# Patient Record
Sex: Male | Born: 1985 | Race: Black or African American | Hispanic: No | Marital: Single | Smoking: Current every day smoker
Health system: Southern US, Community
[De-identification: ages and names within clinical notes are randomized; demographics above are authoritative.]

## PROBLEM LIST (undated history)

## (undated) DIAGNOSIS — L732 Hidradenitis suppurativa: Secondary | ICD-10-CM

## (undated) DIAGNOSIS — L0591 Pilonidal cyst without abscess: Secondary | ICD-10-CM

## (undated) DIAGNOSIS — K611 Rectal abscess: Secondary | ICD-10-CM

---

## 2011-01-03 ENCOUNTER — Inpatient Hospital Stay (HOSPITAL_COMMUNITY)
Admission: AD | Admit: 2011-01-03 | Discharge: 2011-01-09 | DRG: 348 | Disposition: A | Discharge status: Home / Self Care | Payer: Self-pay | Source: Ambulatory Visit | Attending: Surgery | Admitting: Surgery

## 2011-01-03 DIAGNOSIS — L02219 Cutaneous abscess of trunk, unspecified: Secondary | ICD-10-CM | POA: Diagnosis present

## 2011-01-03 DIAGNOSIS — K5909 Other constipation: Secondary | ICD-10-CM | POA: Diagnosis present

## 2011-01-03 DIAGNOSIS — B9689 Other specified bacterial agents as the cause of diseases classified elsewhere: Secondary | ICD-10-CM | POA: Diagnosis present

## 2011-01-03 DIAGNOSIS — L03319 Cellulitis of trunk, unspecified: Secondary | ICD-10-CM | POA: Diagnosis present

## 2011-01-03 DIAGNOSIS — T50995A Adverse effect of other drugs, medicaments and biological substances, initial encounter: Secondary | ICD-10-CM | POA: Diagnosis present

## 2011-01-03 DIAGNOSIS — K612 Anorectal abscess: Principal | ICD-10-CM | POA: Diagnosis present

## 2011-01-03 HISTORY — PX: OTHER SURGICAL HISTORY: SHX169

## 2011-01-03 LAB — BASIC METABOLIC PANEL
BUN: 15 mg/dL (ref 6–23)
Chloride: 100 mEq/L (ref 96–112)
Glucose, Bld: 90 mg/dL (ref 70–99)
Potassium: 4.2 mEq/L (ref 3.5–5.1)

## 2011-01-03 LAB — URINALYSIS, ROUTINE W REFLEX MICROSCOPIC
Nitrite: NEGATIVE
Protein, ur: 30 mg/dL — AB
pH: 5.5 (ref 5.0–8.0)

## 2011-01-03 LAB — CBC
MCH: 23.2 pg — ABNORMAL LOW (ref 26.0–34.0)
MCHC: 32.3 g/dL (ref 30.0–36.0)
Platelets: 281 10*3/uL (ref 150–400)
RBC: 5.6 MIL/uL (ref 4.22–5.81)

## 2011-01-04 LAB — CBC
HCT: 37.5 % — ABNORMAL LOW (ref 39.0–52.0)
MCHC: 32.5 g/dL (ref 30.0–36.0)
MCV: 72 fL — ABNORMAL LOW (ref 78.0–100.0)
RDW: 12.5 % (ref 11.5–15.5)

## 2011-01-05 ENCOUNTER — Other Ambulatory Visit: Payer: Self-pay | Admitting: General Surgery

## 2011-01-05 LAB — CBC
HCT: 36.7 % — ABNORMAL LOW (ref 39.0–52.0)
MCHC: 31.9 g/dL (ref 30.0–36.0)
RDW: 12.6 % (ref 11.5–15.5)

## 2011-01-06 LAB — CBC
MCH: 23.2 pg — ABNORMAL LOW (ref 26.0–34.0)
Platelets: 339 10*3/uL (ref 150–400)
RBC: 4.88 MIL/uL (ref 4.22–5.81)
RDW: 12.8 % (ref 11.5–15.5)

## 2011-01-07 LAB — DIFFERENTIAL
Blasts: 0 %
Eosinophils Absolute: 0.4 10*3/uL (ref 0.0–0.7)
Eosinophils Relative: 4 % (ref 0–5)
Myelocytes: 0 %
Neutro Abs: 3.3 10*3/uL (ref 1.7–7.7)
Neutrophils Relative %: 33 % — ABNORMAL LOW (ref 43–77)
Promyelocytes Absolute: 0 %
nRBC: 0 /100 WBC

## 2011-01-07 LAB — CBC
HCT: 36.3 % — ABNORMAL LOW (ref 39.0–52.0)
Hemoglobin: 11.9 g/dL — ABNORMAL LOW (ref 13.0–17.0)
MCV: 71.3 fL — ABNORMAL LOW (ref 78.0–100.0)
RBC: 5.09 MIL/uL (ref 4.22–5.81)
WBC: 10.1 10*3/uL (ref 4.0–10.5)

## 2011-01-08 LAB — CBC
HCT: 36.5 % — ABNORMAL LOW (ref 39.0–52.0)
MCH: 23.3 pg — ABNORMAL LOW (ref 26.0–34.0)
MCHC: 32.6 g/dL (ref 30.0–36.0)
RDW: 12.6 % (ref 11.5–15.5)

## 2011-01-08 LAB — DIFFERENTIAL
Band Neutrophils: 0 % (ref 0–10)
Basophils Relative: 0 % (ref 0–1)
Eosinophils Absolute: 0.7 10*3/uL (ref 0.0–0.7)
Eosinophils Relative: 9 % — ABNORMAL HIGH (ref 0–5)
Lymphocytes Relative: 27 % (ref 12–46)
Lymphs Abs: 2.2 10*3/uL (ref 0.7–4.0)
Metamyelocytes Relative: 0 %
Monocytes Absolute: 1.1 10*3/uL — ABNORMAL HIGH (ref 0.1–1.0)
Monocytes Relative: 14 % — ABNORMAL HIGH (ref 3–12)

## 2011-01-08 NOTE — H&P (Signed)
  NAMETRELL, Huber          ACCOUNT NO.:  [id number]  MEDICAL RECORD NO.:  [id number]  LOCATION:                                 FACILITY:  PHYSICIAN:  Lorne Skeens. Elis Sauber, M.D.DATE OF BIRTH:  10/23/1985  DATE OF ADMISSION:  01/03/2011 DATE OF DISCHARGE:                             HISTORY & PHYSICAL   Note, he does not yet have a medical record number assigned.  CHIEF COMPLAINT:  Increasing pain and swelling, buttock and perineum.  HISTORY:  Richard Huber is a 25 year old male referred from [facility] today by Dr. Faustino Congress.  He has an approximately 5- day history of progressive increasing pain and swelling along his right buttock extending up towards his scrotum.  Today it is exquisitely painful.  He says he felt although has had no documented fever.  His mother states he has history of a pilonidal abscesses and some tooth abscesses but no infection of these exact area.  PAST MEDICAL HISTORY:  Other than above, unremarkable with no previous surgery, illness or hospitalizations.  MEDICATIONS:  None.  ALLERGIES:  None.  SOCIAL HISTORY:  Smokes an occasional cigarette.  Drinks alcohol rarely. He is employed.  Single.  REVIEW OF SYSTEMS:  GI:  He has some difficulty in pain with bowel movements with this illness.  Review of systems generally unremarkable.  PHYSICAL EXAMINATION:  VITAL SIGNS:  He is 6 feet 4 inches, weight 250 pounds, blood pressure 176/80, pulse is 112, temperature 99.2. GENERAL:  He is a muscular black male, who is in obvious pain. HEENT:  No masses.  Sclerae nonicteric.  Oropharynx clear. LUNGS:  Clear without wheezing or increased work of breathing. CARDIAC:  Regular, tachycardia.  No murmurs. ABDOMEN:  Generally soft and nontender. RECTAL:  There is a fairly large area of induration and tenderness along the right buttock perianal area extending up towards the base of the scrotum.  This is exquisitely tender to touch and he  really will not let me examine him to any degree particularly around the anus.  ASSESSMENT/PLAN:  Perineal or perirectal induration, cellulitis, probable perirectal or perineal abscess.  Certainly more than can be handled in the office setting.  We will plan to admit the patient to [facility].  I have discussed with Dr. Daphine Deutscher.  He is on call tonight and will proceed with exam under anesthesia and probable I and D.  He is receiving broad-spectrum IV antibiotics.     Lorne Skeens. Kandee Escalante, M.D.     Tory Emerald  D:  01/03/2011  T:  01/04/2011  Job:  [id number]  Electronically Signed by Glenna Fellows M.D. on 01/08/2011 01:41:22 PM

## 2011-01-10 LAB — ANAEROBIC CULTURE

## 2011-01-10 NOTE — Op Note (Signed)
  NAMEISAM, UNREIN          ACCOUNT NO.:  [id number]  MEDICAL RECORD NO.:  [id number]  LOCATION:  1533                         FACILITY:  [facility]  PHYSICIAN:  Thornton Park. Daphine Deutscher, MD  DATE OF BIRTH:  [birth year]  DATE OF PROCEDURE: DATE OF DISCHARGE:                              OPERATIVE REPORT   PROCEDURE:  Exam under anesthesia with an incision and drainage of complex perirectal abscess.  Cultures obtained.  SURGEON:  Thornton Park. Daphine Deutscher, MD  ANESTHESIA:  General endotracheal.  DESCRIPTION OF PROCEDURE:  This 25 year old African-American man was seen in the office earlier by Dr. Glenna Fellows and sent to [facility] from the urgent office for drainage of a complex perineal abscess. The area of induration was really down on the right side and then extended up at the area between his anus and his scrotum where there was induration.  Initially after prepping and draping him, I went into the area on the right side with an 18-gauge needle and got no frank pus but I went ahead because that was where it felt more flexion and opened that up and was able to insert my finger.  Soon thereafter, entered the abscess cavity which I found extended up toward the scrotum that kind of went over slightly to the left.  With my finger inside, I cut down just to the skin into the abscess cavity where it was kind of coming down on the left and brought that out.  I cut a 1/4-inch Penrose drain and actually fenestrated on the inside and then placed it in this cavity, so that it could be kept open to drain up both sides.  I irrigated with hydrogen peroxide.  As I mentioned, cultures were obtained.  A pad was placed and a sterile mesh panties were used.  The patient would be kept on Unasyn and will be admitted to the [facility].     Thornton Park Daphine Deutscher, MD     MBM/MEDQ  D:  01/03/2011  T:  01/04/2011  Job:  [id number]  Electronically Signed by Luretha Murphy MD on 01/10/2011 04:33:38 PM

## 2011-01-29 LAB — CULTURE, ROUTINE-ABSCESS

## 2011-01-30 NOTE — Discharge Summary (Signed)
Richard Huber          ACCOUNT NO.:  [id number]  MEDICAL RECORD NO.:  [id number]  LOCATION:  1533                         FACILITY:  [facility]  PHYSICIAN:  Thornton Park. Daphine Deutscher, MD  DATE OF BIRTH:  Feb 06, 1986  DATE OF ADMISSION:  01/03/2011 DATE OF DISCHARGE:  01/09/2011                              DISCHARGE SUMMARY   HISTORY OF PRESENT ILLNESS:  Richard Huber is a 25 year old gentleman who presented to the office with complaint of increasing pain and swelling along the right buttock extending towards the scrotum.  He had associated fever.  The patient was seen in the office by Dr. Johna Sheriff who felt that this patient needed operative incision and drainage and admission for inpatient antibiotics.  Therefore, the patient agreed to admission on January 03, 2011.  SUMMARY OF HOSPITAL COURSE:  The patient was admitted on January 03, 2011, was taken to the operating room by Dr. Wenda Low and underwent incision and drainage of perirectal/perineal abscess.  His admission white blood cell count was noted to be approximately 20,000.  The patient had a Penrose drain placed at that time.  Postoperatively the patient felt better and was not having any significant pain.  However, his white blood cell count really never budged.  There was minimal drainage coming from the Penrose drain after the first 36 hours or so. His white blood cell count remained at 20,000.  Therefore, Dr. Zachery Dakins felt it was appropriate to perform a repeat incision and drainage to possibly drain a deeper area.  Therefore he was taken to the operating room on January 05, 2011, and underwent exam under anesthesia and ultrasound and opening of previously drained abscess site with debridement of some necrotic tissue.  The patient tolerated this procedure well, had some packing placed in addition to his Penrose drain.  His secondary drainage was more successful in alleviating the abscess cavity.  His temperature curve  immediately went to normal.  His white blood cell count trended down to 12 by the next day and then eventually to a normal level.  His Penrose has been removed.  He has been doing sitz baths and soaks in the bathroom and has done well.  The patient has developed some constipation secondary to pain medication use but is otherwise using some MiraLax and stool softeners.  He has not been having any abdominal distention or pain.  The patient's cultures are still pending but have preliminarily grown gram-negative rods and gram- positive cocci.  We suspect mixed flora.  I feel the patient is appropriate for discharge home.  At present time he has been afebrile and has had normal white count for the past 48 hours and his pain is well controlled with p.o. pain medication.  DISCHARGE DIAGNOSES: 1. Perirectal/perineal abscess status post incision and drainage x2. 2. Constipation secondary to narcotic use.  DISCHARGE MEDICATIONS:  The patient's discharge medications will be: 1. Augmentin 875 mg 1 tablet twice a day. 2. Colace 100 mg twice daily. 3. Percocet 5/325 one to two tablets q.4 h p.r.n. pain. 4. MiraLax 17 g dose twice daily. 5. Ibuprofen 200 mg tablets 2 to 4 tablets q.8 h p.r.n. pain.  PLAN:  He is asked to continue warm soaks  in the tub or use of a shower sprayer keeping this area.  He can keep it dry, absorbent pad back in that area to absorb any drainage.  Advised against using anything like peroxide, Betadine or powders.  He is to come back and see Dr. Daphine Deutscher or Dr. Zachery Dakins in the office in approximately 7 to 10 days for recheck.     Brayton El, PA-C   ______________________________ Thornton Park Daphine Deutscher, MD    KB/MEDQ  D:  01/09/2011  T:  01/09/2011  Job:  [id number]  Electronically Signed by Brayton El  on 01/22/2011 02:36:13 PM Electronically Signed by Luretha Murphy MD on 01/30/2011 10:16:16 AM

## 2011-02-06 NOTE — Op Note (Signed)
[name], [name]          ACCOUNT NO.:  [id number]  MEDICAL RECORD NO.:  [id number]  LOCATION:  1533                         FACILITY:  [facility]  PHYSICIAN:  Anselm Pancoast. Raylen Tangonan, M.D.DATE OF BIRTH:  July 14, 1986  DATE OF PROCEDURE:  01/05/2011 DATE OF DISCHARGE:                              OPERATIVE REPORT   PREOPERATIVE DIAGNOSIS:  Persistent perianal infection, status post drainage of a kind of superficial large perianal abscess anteriorly by Dr. Daphine Deutscher 48 hours earlier.  OPERATION:  Examination under anesthesia in lithotomy position, anal ultrasound, and opening of the previously drained site and debridement of superficial chronic abscess, general anesthesia.  HISTORY:  Richard Huber is a 25 year old quite large black male who was admitted after the urgent office on Wednesday.  The patient was seen by Dr. Johna Sheriff and it was impossible to actually examine him in the office because of the pain.  He was sent over and taken to the operating room.  Dr. Daphine Deutscher was on-call and he examined him in the lithotomy position, put a needle on the left side where he had a large area of induration, but no pus was encountered, and then made a little incision to the left of anterior midline where there was a small amount of infection and then bluntly with his finger connected the 2 areas, kind of washed out, did a culture, and placed a 1/4 inch Penrose drain.  The patient, the following morning, was feeling better.  He had a white count on admission that was about 21,000.  The following morning, the patient's pain was definitely less.  He was having some purulent drainage through the little Penrose drain and his white count was down to 19,300 and the patient's tachycardia had decreased, but he was still in pretty significant pain.  The cultures so far are showing kind of a mixed bacteria, no anaerobes.  We do not actually have a final culture yet.  Today, the patient was still  having pain and his white count is back to 20,500 again.  Attempts to examine the patient in the bed basically were unsuccessful.  We tried to do a rectal exam and he just fought [place].  The little drainage from the little Penrose drain area was kind of soupy area and I thought that he probably had an undrained area on my examination and with a white count.  I got Dr. Ezzard Standing who worked last night to examine the patient and he was in agreement that the incisions were small.  The patient was certainly tender and whether a repeat exploration or might be a CT or what, but clinically he was acting as if there was still an area that was not adequately drained. The patient said we could drain him or reexamine him and drain him in the operating room as long as he was asleep, he just did not want [place] to do anything, trying to do it under local anesthesia at bedside or examination.  I recommended that we take him back to the OR, do an anal ultrasound the first thing to make sure I could not see a perirectal abscess, examine him very thoroughly, and then open the incision somewhat more.  DESCRIPTION OF PROCEDURE:  The  patient was taken to the operative suite. He was given a dose of Zosyn, which he was on, and induction of general anesthesia,  endotracheal tube, and then put up in the [place] stirrups.  First, on examining, there was this kind of drainage from the little Penrose drain sites and first I did a careful rectal exam, I did not feel any fluctuance or anything on the rectal exam.  Next, I used an anal ultrasound, not the rectal but the anal, and you could see the sphincter, you could go see the various areas, you could see a little air up in the subcutaneous tissue where the little Penrose drain was, but nothing that looked like a deep perirectal abscess that I could appreciate.  I then put a little peroxide in the little drain site and re-anoscoped him and you could see the little air  better, but no additional inflammation was encountered.  Next, I went ahead and prepped him with Betadine scrub and solution, draped him in a sterile manner. Time-out was then completed a second time and we then used an anoscope to look into the anus.  I used a little Shepherd hook, I could not find any evidence of the internal fistula either internally or externally. With him in this position and the anoscope being unsuccessful, I then opened up the area where he had got more induration, which was on the right side, not really going down to the thigh and the skin itself looked good, and just opened the excision kind of closer to groin towards the midline, not really going to the anus, and then used the __________ and, etc., and just debrided this chronic infection and removed the chronically infected tissue that I could visualize.  With this and then with finger in his anus, palpated, etc., I could not see any pockets of pus growing up, etc.  I then opened the little incision that was anteriorly in a similar manner and from Dr. Ermalene Searing description it appears that the area of the smaller incision anteriorly to the left was where he actually encountered the actual pus, but I could find anything that looked like a fistulous tract going to the anterior anal area.  I debrided out the infected soft tissue, no additional cultures were performed, and then replaced the little 1/4 inch Penrose with a 1/2 inch Penrose drain and then sutured the 2 ends together so that we got a much larger area for drainage.  I then packed the cavity with three open 4 x 4's soaked in Betadine and then sutured the ends all together so that the packing can be removed tomorrow.  If he is still having a persistent leukocytosis and has much pain, he may need a CT.  I think I will add Septra or vancomycin to his wound cultures and his mother, who I talked to, says that he has had an infected tooth in the last year or two  and his face swells up __________ to that and then about 6 months ago he had a pilonidal area, but that healed up fairly quickly and I am not finding any evidence of any infection posteriorly.  Hopefully, I have not missed a perirectal or ischiorectal abscess and we will continue to treat him with the antibiotics IV.     Anselm Pancoast. Zachery Dakins, M.D.     WJW/MEDQ  D:  01/05/2011  T:  01/06/2011  Job:  [id number]  Electronically Signed by Consuello Bossier M.D. on 02/06/2011 03:48:46 PM

## 2011-02-09 ENCOUNTER — Encounter (INDEPENDENT_AMBULATORY_CARE_PROVIDER_SITE_OTHER): Payer: Self-pay | Admitting: General Surgery

## 2013-02-24 ENCOUNTER — Emergency Department (HOSPITAL_COMMUNITY)
Admission: EM | Admit: 2013-02-24 | Discharge: 2013-02-24 | Disposition: A | Discharge status: Home / Self Care | Payer: Self-pay | Attending: Emergency Medicine | Admitting: Emergency Medicine

## 2013-02-24 ENCOUNTER — Encounter (HOSPITAL_COMMUNITY): Payer: Self-pay

## 2013-02-24 DIAGNOSIS — Z872 Personal history of diseases of the skin and subcutaneous tissue: Secondary | ICD-10-CM | POA: Insufficient documentation

## 2013-02-24 DIAGNOSIS — F17200 Nicotine dependence, unspecified, uncomplicated: Secondary | ICD-10-CM | POA: Insufficient documentation

## 2013-02-24 DIAGNOSIS — L0501 Pilonidal cyst with abscess: Secondary | ICD-10-CM | POA: Insufficient documentation

## 2013-02-24 DIAGNOSIS — IMO0001 Reserved for inherently not codable concepts without codable children: Secondary | ICD-10-CM | POA: Insufficient documentation

## 2013-02-24 DIAGNOSIS — R509 Fever, unspecified: Secondary | ICD-10-CM | POA: Insufficient documentation

## 2013-02-24 MED ORDER — SODIUM CHLORIDE 0.9 % IV BOLUS (SEPSIS): 1000.0000 mL | Freq: Once | INTRAVENOUS | Status: DC

## 2013-02-24 MED ORDER — DOXYCYCLINE HYCLATE 100 MG PO CAPS: 100.0000 mg | ORAL_CAPSULE | Freq: Two times a day (BID) | ORAL | Status: DC

## 2013-02-24 MED ORDER — OXYCODONE-ACETAMINOPHEN 5-325 MG PO TABS: 2.0000 | ORAL_TABLET | ORAL | Status: DC | PRN

## 2013-02-24 MED ORDER — CLINDAMYCIN PHOSPHATE 600 MG/50ML IV SOLN
600.0000 mg | Freq: Once | INTRAVENOUS | Status: DC
  Filled 2013-02-24: qty 50

## 2013-02-24 NOTE — ED Notes (Signed)
Boil present at lower back. No drainage. Swelling present very tender. Denies N/V/D and fever

## 2013-02-24 NOTE — ED Provider Notes (Signed)
CSN: [id number]     Arrival date & time 02/24/13  1740 History     First MD Initiated Contact with Patient 02/24/13 1811     Chief Complaint  Patient presents with  . Recurrent Skin Infections   (Consider location/radiation/quality/duration/timing/severity/associated sxs/prior Treatment) HPI  Richard Huber is a(n) 27 y.o. male who presents with chief complaint of abscess.  He history of recurrent abscess of the gluteal cleft.  Patient states it began Thursday with some mild pain at the superior aspect of the gluteal cleft on the left side.  He's had increasing tenderness and pain in that area.  He states he's had this several times before and generally uses warm compresses until it opens and drains.  He has had to have several I&D for this area and has had previous admission for cellulitis.  He states that over the past 2 days he's had subjective fever, chills and myalgia.. Denies DOE, SOB, chest tightness or pressure, radiation to left arm, jaw or back, or diaphoresis. Denies dysuria, flank pain, suprapubic pain, frequency, urgency, or hematuria. Denies headaches, light headedness, weakness, visual disturbances. Denies abdominal pain, nausea, vomiting, diarrhea or constipation.     Past Medical History  Diagnosis Date  . Boil    History reviewed. No pertinent past surgical history. No family history on file. History  Substance Use Topics  . Smoking status: Current Every Day Smoker  . Smokeless tobacco: Not on file  . Alcohol Use: Yes    Review of Systems Ten systems reviewed and are negative for acute change, except as noted in the HPI.   Allergies  Review of patient's allergies indicates no known allergies.  Home Medications   Current Outpatient Rx  Name  Route  Sig  Dispense  Refill  . ibuprofen (ADVIL,MOTRIN) 200 MG tablet   Oral   Take 200 mg by mouth every 6 (six) hours as needed for pain.          BP 145/90  Pulse 116  Temp(Src) 99.9 F (37.7 C) (Oral)   Resp 18  Ht 6\' 4"  (1.93 m)  Wt 300 lb (136.079 kg)  BMI 36.53 kg/m2  SpO2 99% Physical Exam  Nursing note and vitals reviewed. Constitutional:  Patient lying face down on the examining table.  HENT:  Head: Normocephalic and atraumatic.  Eyes: Conjunctivae are normal. No scleral icterus.  Neck: Normal range of motion. Neck supple.  Cardiovascular: Normal rate, regular rhythm and normal heart sounds.   Pulmonary/Chest: Effort normal and breath sounds normal. No respiratory distress.  Abdominal: Soft. There is no tenderness.  Musculoskeletal: He exhibits no edema.  Neurological: He is alert.  Skin: Skin is warm and dry.  6 x 5 cm area of fluctuance on the left superior gluteal cleft with surrounding induration of approximately 10 cm.  No streaking.  Psychiatric: His behavior is normal.   . ED Course   INCISION AND DRAINAGE Date/Time: 02/24/2013 7:20 PM Performed by: Arthor Captain Authorized by: Arthor Captain Consent: Verbal consent obtained. Risks and benefits: risks, benefits and alternatives were discussed Consent given by: patient Patient identity confirmed: verbally with patient Time out: Immediately prior to procedure a "time out" was called to verify the correct patient, procedure, equipment, support staff and site/side marked as required. Type: abscess Body area: anogenital Location details: pilonidal Anesthesia: local infiltration Local anesthetic: lidocaine 2% with epinephrine Anesthetic total: 3 ml Patient sedated: no Scalpel size: 11 Needle gauge: 22 Incision type: single straight Complexity: simple Drainage: purulent Drainage amount:  copious Packing material: 1/4 in iodoform gauze Patient tolerance: Patient tolerated the procedure well with no immediate complications.   (including critical care time)  Labs Reviewed - No data to display No results found. No diagnosis found.  MDM  7:20 PM Filed Vitals:   02/24/13 1919 02/24/13 1922 02/24/13 1922  02/24/13 1927  BP: 142/86   149/92  Pulse:  78  85  Temp:   99.9 F (37.7 C)   TempSrc:   Oral   Resp:    16  Height:      Weight:      SpO2:  99%       Patient with I&D of pilonidal abscess. Patient borderline febrile had some mild tachycardia which I suspect is likely due to the amount of pain in his head.  Patient's heart rate now low 80s.  Treatment for incision and drainage performed with copious flushing and sterile packing.  Patient will be discharged with pain medication, return for a wound check in 2 days.  Will discharge with antibiotics.   Arthor Captain, PA-C 02/24/13 1928

## 2013-02-24 NOTE — ED Notes (Signed)
Pt diaphoretic in triage. Pt given cool cloth and assisted to wheelchair.  Transferred to room 24. RN made aware of pt current status.

## 2013-02-25 NOTE — ED Provider Notes (Signed)
Medical screening examination/treatment/procedure(s) were performed by non-physician practitioner and as supervising physician I was immediately available for consultation/collaboration.  Flint Melter, MD 02/25/13 0001

## 2013-02-26 ENCOUNTER — Emergency Department (INDEPENDENT_AMBULATORY_CARE_PROVIDER_SITE_OTHER)
Admission: EM | Admit: 2013-02-26 | Discharge: 2013-02-26 | Disposition: A | Discharge status: Home / Self Care | Payer: Self-pay | Source: Home / Self Care | Attending: Family Medicine | Admitting: Family Medicine

## 2013-02-26 ENCOUNTER — Encounter (HOSPITAL_COMMUNITY): Payer: Self-pay | Admitting: Emergency Medicine

## 2013-02-26 DIAGNOSIS — L0501 Pilonidal cyst with abscess: Secondary | ICD-10-CM

## 2013-02-26 NOTE — ED Provider Notes (Signed)
Medical screening examination/treatment/procedure(s) were performed by resident physician or non-physician practitioner and as supervising physician I was immediately available for consultation/collaboration.   Anetha Slagel DOUGLAS MD.   Kenzlie Disch D Adaora Mchaney, MD 02/26/13 2019 

## 2013-02-26 NOTE — ED Notes (Signed)
Patient here today for packing removal .  Dressing visible to buttocks.  Instructed to undress for physician evaluation.

## 2013-02-26 NOTE — ED Provider Notes (Signed)
CSN: [id number]     Arrival date & time 02/26/13  1712 History     None    Chief Complaint  Patient presents with  . Wound Check   (Consider location/radiation/quality/duration/timing/severity/associated sxs/prior Treatment) HPI  Pilonidal abscess I&D 2 days ago and placed on Doxy and Percocet. Here today for wound check. Improving per pt. Denies n/v/d/ fevers. Keeping area clean w/ minimal removal of iodiform gauze. Pain is well controlled even w/o percocet. Still w/ clear reddish discharge that is slowing down.   Past Medical History  Diagnosis Date  . Boil    History reviewed. No pertinent past surgical history. No family history on file. History  Substance Use Topics  . Smoking status: Current Every Day Smoker  . Smokeless tobacco: Not on file  . Alcohol Use: Yes    Review of Systems  Constitutional: Negative for fever, chills and activity change.  Respiratory: Negative for shortness of breath.   Cardiovascular: Negative for chest pain and palpitations.  Gastrointestinal: Negative for abdominal distention.  Genitourinary: Negative for dysuria and flank pain.  All other systems reviewed and are negative.    Allergies  Review of patient's allergies indicates no known allergies.  Home Medications   Current Outpatient Rx  Name  Route  Sig  Dispense  Refill  . doxycycline (VIBRAMYCIN) 100 MG capsule   Oral   Take 1 capsule (100 mg total) by mouth 2 (two) times daily. One po bid x 7 days   14 capsule   0   . ibuprofen (ADVIL,MOTRIN) 200 MG tablet   Oral   Take 200 mg by mouth every 6 (six) hours as needed for pain.         Marland Kitchen oxyCODONE-acetaminophen (PERCOCET) 5-325 MG per tablet   Oral   Take 2 tablets by mouth every 4 (four) hours as needed for pain.   20 tablet   0    BP 128/86  Pulse 60  Temp(Src) 98.8 F (37.1 C) (Oral)  Resp 16  SpO2 100% Physical Exam  Constitutional: He is oriented to person, place, and time. He appears well-nourished. No  distress.  HENT:  Head: Normocephalic and atraumatic.  Eyes: EOM are normal. Pupils are equal, round, and reactive to light.  Neck: Normal range of motion. Neck supple.  Cardiovascular: Normal rate.   Pulmonary/Chest: Effort normal.  Abdominal: Soft. He exhibits no distension.  Musculoskeletal: Normal range of motion.  Neurological: He is alert and oriented to person, place, and time. No cranial nerve deficit. Coordination normal.  Skin: Skin is warm and dry. He is not diaphoretic.  Pilonidal abscess w/ minimal bloody discharge and 1/4inch iodiform gauze present.   Psychiatric: He has a normal mood and affect. His behavior is normal.    ED Course   Procedures (including critical care time)  Abscess probed w/ blunt Qtip w/o presence of loculations. Depth about 1.5cm. 1/4 inch iodiform gauze removed (only about 1/2 inch present in wound. Abscess flushed w/ 10cc NS. Silvadene cream applied along w/ 2x2 steril gauze. Pt tolerated procedure w/o pain.    Labs Reviewed - No data to display No results found. No diagnosis found.  MDM  26yo AAM w/ resolving pilonidal cyst. Packing removed and wound flushed and explored. Granulation tissue present w/o purulent discharge Apply sterile.  - cont Doxy - warm compresses - keep area clean - sterile gauze until drainage stops - f/u PRN  Shelly Flatten, MD Family Medicine PGY-3 02/26/2013, 6:03 PM    Elmon Else  Konrad Dolores, MD 02/26/13 [phone]

## 2013-02-26 NOTE — ED Notes (Signed)
Not in treatment room 

## 2013-09-30 ENCOUNTER — Encounter (HOSPITAL_COMMUNITY): Payer: MEDICAID | Admitting: Anesthesiology

## 2013-09-30 ENCOUNTER — Inpatient Hospital Stay (HOSPITAL_COMMUNITY)
Admission: EM | Admit: 2013-09-30 | Discharge: 2013-10-02 | DRG: 349 | Disposition: A | Discharge status: Home Health | Payer: Self-pay | Attending: General Surgery | Admitting: General Surgery

## 2013-09-30 ENCOUNTER — Encounter (HOSPITAL_COMMUNITY): Payer: Self-pay | Admitting: Emergency Medicine

## 2013-09-30 ENCOUNTER — Encounter (HOSPITAL_COMMUNITY): Admission: EM | Disposition: A | Discharge status: Home Health | Payer: Self-pay | Source: Home / Self Care

## 2013-09-30 ENCOUNTER — Ambulatory Visit (HOSPITAL_COMMUNITY): Payer: Self-pay | Admitting: Anesthesiology

## 2013-09-30 ENCOUNTER — Emergency Department (HOSPITAL_COMMUNITY): Payer: Self-pay

## 2013-09-30 DIAGNOSIS — L0591 Pilonidal cyst without abscess: Secondary | ICD-10-CM | POA: Diagnosis present

## 2013-09-30 DIAGNOSIS — F1210 Cannabis abuse, uncomplicated: Secondary | ICD-10-CM | POA: Diagnosis present

## 2013-09-30 DIAGNOSIS — F1290 Cannabis use, unspecified, uncomplicated: Secondary | ICD-10-CM | POA: Diagnosis present

## 2013-09-30 DIAGNOSIS — K612 Anorectal abscess: Secondary | ICD-10-CM

## 2013-09-30 DIAGNOSIS — K604 Rectal fistula: Secondary | ICD-10-CM

## 2013-09-30 DIAGNOSIS — K610 Anal abscess: Secondary | ICD-10-CM

## 2013-09-30 DIAGNOSIS — F17200 Nicotine dependence, unspecified, uncomplicated: Secondary | ICD-10-CM | POA: Diagnosis present

## 2013-09-30 DIAGNOSIS — K611 Rectal abscess: Secondary | ICD-10-CM | POA: Diagnosis present

## 2013-09-30 HISTORY — PX: INCISION AND DRAINAGE PERIRECTAL ABSCESS: SHX1804

## 2013-09-30 HISTORY — DX: Pilonidal cyst without abscess: L05.91

## 2013-09-30 HISTORY — DX: Rectal abscess: K61.1

## 2013-09-30 LAB — BASIC METABOLIC PANEL
BUN: 12 mg/dL (ref 6–23)
CHLORIDE: 102 meq/L (ref 96–112)
CO2: 22 mEq/L (ref 19–32)
Calcium: 9.9 mg/dL (ref 8.4–10.5)
Creatinine, Ser: 1.3 mg/dL (ref 0.50–1.35)
GFR calc Af Amer: 87 mL/min — ABNORMAL LOW (ref >90)
GFR calc non Af Amer: 75 mL/min — ABNORMAL LOW (ref >90)
GLUCOSE: 99 mg/dL (ref 70–99)
POTASSIUM: 4.1 meq/L (ref 3.7–5.3)
Sodium: 140 mEq/L (ref 137–147)

## 2013-09-30 LAB — CBC WITH DIFFERENTIAL/PLATELET
Basophils Absolute: 0 10*3/uL (ref 0.0–0.1)
Basophils Relative: 0 % (ref 0–1)
Eosinophils Absolute: 0.4 10*3/uL (ref 0.0–0.7)
Eosinophils Relative: 2 % (ref 0–5)
HCT: 43.1 % (ref 39.0–52.0)
HEMOGLOBIN: 14.2 g/dL (ref 13.0–17.0)
LYMPHS ABS: 2.6 10*3/uL (ref 0.7–4.0)
Lymphocytes Relative: 13 % (ref 12–46)
MCH: 24.1 pg — AB (ref 26.0–34.0)
MCHC: 32.9 g/dL (ref 30.0–36.0)
MCV: 73.3 fL — ABNORMAL LOW (ref 78.0–100.0)
MONOS PCT: 10 % (ref 3–12)
Monocytes Absolute: 2 10*3/uL — ABNORMAL HIGH (ref 0.1–1.0)
NEUTROS ABS: 15.2 10*3/uL — AB (ref 1.7–7.7)
NEUTROS PCT: 75 % (ref 43–77)
Platelets: 344 10*3/uL (ref 150–400)
RBC: 5.88 MIL/uL — AB (ref 4.22–5.81)
RDW: 13.2 % (ref 11.5–15.5)
WBC: 20.2 10*3/uL — ABNORMAL HIGH (ref 4.0–10.5)

## 2013-09-30 LAB — URINE MICROSCOPIC-ADD ON

## 2013-09-30 LAB — URINALYSIS, ROUTINE W REFLEX MICROSCOPIC
BILIRUBIN URINE: NEGATIVE
Glucose, UA: NEGATIVE mg/dL
Ketones, ur: NEGATIVE mg/dL
LEUKOCYTES UA: NEGATIVE
NITRITE: NEGATIVE
PH: 5.5 (ref 5.0–8.0)
Protein, ur: 30 mg/dL — AB
SPECIFIC GRAVITY, URINE: 1.019 (ref 1.005–1.030)
Urobilinogen, UA: 0.2 mg/dL (ref 0.0–1.0)

## 2013-09-30 LAB — SURGICAL PCR SCREEN
MRSA, PCR: NEGATIVE
Staphylococcus aureus: NEGATIVE

## 2013-09-30 SURGERY — INCISION AND DRAINAGE, ABSCESS, PERIRECTAL
Anesthesia: General | Site: Rectum

## 2013-09-30 MED ORDER — HYDROMORPHONE HCL PF 1 MG/ML IJ SOLN
INTRAMUSCULAR | Status: AC
  Filled 2013-09-30: qty 1

## 2013-09-30 MED ORDER — PIPERACILLIN-TAZOBACTAM 3.375 G IVPB
3.3750 g | Freq: Three times a day (TID) | INTRAVENOUS | Status: DC
  Administered 2013-09-30 – 2013-10-02 (×6): 3.375 g via INTRAVENOUS
  Filled 2013-09-30 (×10): qty 50

## 2013-09-30 MED ORDER — PROPOFOL 10 MG/ML IV BOLUS
INTRAVENOUS | Status: DC | PRN
  Administered 2013-09-30 (×2): 100 mg via INTRAVENOUS
  Administered 2013-09-30: 200 mg via INTRAVENOUS

## 2013-09-30 MED ORDER — IOHEXOL 300 MG/ML  SOLN
100.0000 mL | Freq: Once | INTRAMUSCULAR | Status: AC | PRN
  Administered 2013-09-30: 100 mL via INTRAVENOUS

## 2013-09-30 MED ORDER — HYDROMORPHONE HCL PF 1 MG/ML IJ SOLN
1.0000 mg | Freq: Once | INTRAMUSCULAR | Status: AC
  Administered 2013-09-30: 1 mg via INTRAVENOUS
  Filled 2013-09-30: qty 1

## 2013-09-30 MED ORDER — HYDROMORPHONE HCL PF 1 MG/ML IJ SOLN
1.0000 mg | INTRAMUSCULAR | Status: DC | PRN
  Administered 2013-10-01: 1 mg via INTRAVENOUS
  Filled 2013-09-30: qty 1

## 2013-09-30 MED ORDER — ACETAMINOPHEN 10 MG/ML IV SOLN
1000.0000 mg | Freq: Four times a day (QID) | INTRAVENOUS | Status: DC
  Filled 2013-09-30: qty 100

## 2013-09-30 MED ORDER — ONDANSETRON HCL 4 MG PO TABS
4.0000 mg | ORAL_TABLET | Freq: Four times a day (QID) | ORAL | Status: DC | PRN
  Filled 2013-09-30: qty 1

## 2013-09-30 MED ORDER — KETAMINE HCL 10 MG/ML IJ SOLN
INTRAMUSCULAR | Status: DC | PRN
  Administered 2013-09-30: 25 mg via INTRAVENOUS

## 2013-09-30 MED ORDER — HYDROMORPHONE HCL PF 1 MG/ML IJ SOLN
0.2500 mg | INTRAMUSCULAR | Status: DC | PRN
  Administered 2013-09-30 (×2): 0.5 mg via INTRAVENOUS

## 2013-09-30 MED ORDER — ONDANSETRON HCL 4 MG/2ML IJ SOLN
INTRAMUSCULAR | Status: DC | PRN
  Administered 2013-09-30: 4 mg via INTRAVENOUS

## 2013-09-30 MED ORDER — LACTATED RINGERS IV SOLN: INTRAVENOUS | Status: DC

## 2013-09-30 MED ORDER — MIDAZOLAM HCL 5 MG/5ML IJ SOLN
INTRAMUSCULAR | Status: DC | PRN
  Administered 2013-09-30: 1 mg via INTRAVENOUS

## 2013-09-30 MED ORDER — PIPERACILLIN-TAZOBACTAM 3.375 G IVPB 30 MIN
3.3750 g | Freq: Once | INTRAVENOUS | Status: AC
  Administered 2013-09-30: 3.375 g via INTRAVENOUS
  Filled 2013-09-30: qty 50

## 2013-09-30 MED ORDER — ACETAMINOPHEN 10 MG/ML IV SOLN
INTRAVENOUS | Status: DC | PRN
  Administered 2013-09-30: 1000 mg via INTRAVENOUS

## 2013-09-30 MED ORDER — LACTATED RINGERS IV SOLN
INTRAVENOUS | Status: DC | PRN
  Administered 2013-09-30 (×2): via INTRAVENOUS

## 2013-09-30 MED ORDER — ONDANSETRON HCL 4 MG/2ML IJ SOLN: 4.0000 mg | INTRAMUSCULAR | Status: DC | PRN

## 2013-09-30 MED ORDER — ONDANSETRON HCL 4 MG/2ML IJ SOLN: 4.0000 mg | Freq: Four times a day (QID) | INTRAMUSCULAR | Status: DC | PRN

## 2013-09-30 MED ORDER — FENTANYL CITRATE 0.05 MG/ML IJ SOLN
INTRAMUSCULAR | Status: DC | PRN
  Administered 2013-09-30 (×2): 100 ug via INTRAVENOUS

## 2013-09-30 MED ORDER — IOHEXOL 300 MG/ML  SOLN
50.0000 mL | Freq: Once | INTRAMUSCULAR | Status: AC | PRN
  Administered 2013-09-30: 50 mL via ORAL

## 2013-09-30 MED ORDER — 0.9 % SODIUM CHLORIDE (POUR BTL) OPTIME
TOPICAL | Status: DC | PRN
  Administered 2013-09-30: 1000 mL

## 2013-09-30 MED ORDER — LIDOCAINE HCL (CARDIAC) 20 MG/ML IV SOLN
INTRAVENOUS | Status: DC | PRN
  Administered 2013-09-30: 30 mg via INTRAVENOUS

## 2013-09-30 MED ORDER — HYDROCODONE-ACETAMINOPHEN 5-325 MG PO TABS
1.0000 | ORAL_TABLET | ORAL | Status: DC | PRN
  Administered 2013-09-30 – 2013-10-01 (×2): 2 via ORAL
  Filled 2013-09-30 (×2): qty 2

## 2013-09-30 MED ORDER — MORPHINE SULFATE 2 MG/ML IJ SOLN
1.0000 mg | INTRAMUSCULAR | Status: DC | PRN
  Administered 2013-09-30: 2 mg via INTRAVENOUS
  Administered 2013-09-30: 4 mg via INTRAVENOUS
  Administered 2013-10-01 – 2013-10-02 (×3): 2 mg via INTRAVENOUS
  Filled 2013-09-30 (×3): qty 1
  Filled 2013-09-30 (×2): qty 2

## 2013-09-30 SURGICAL SUPPLY — 29 items
BLADE HEX COATED 2.75 (ELECTRODE) ×3 IMPLANT
BLADE SURG 15 STRL LF DISP TIS (BLADE) ×1 IMPLANT
BLADE SURG 15 STRL SS (BLADE) ×2
CANISTER SUCTION 2500CC (MISCELLANEOUS) ×3 IMPLANT
COVER SURGICAL LIGHT HANDLE (MISCELLANEOUS) ×3 IMPLANT
ELECT REM PT RETURN 9FT ADLT (ELECTROSURGICAL) ×3
ELECTRODE REM PT RTRN 9FT ADLT (ELECTROSURGICAL) ×1 IMPLANT
GAUZE PACKING IODOFORM 1/2 (PACKING) ×3 IMPLANT
GAUZE SPONGE 4X4 16PLY XRAY LF (GAUZE/BANDAGES/DRESSINGS) ×3 IMPLANT
GLOVE BIOGEL PI IND STRL 7.0 (GLOVE) ×1 IMPLANT
GLOVE BIOGEL PI INDICATOR 7.0 (GLOVE) ×2
GLOVE SURG ORTHO 8.0 STRL STRW (GLOVE) ×3 IMPLANT
GOWN STRL REUS W/TWL LRG LVL3 (GOWN DISPOSABLE) IMPLANT
GOWN STRL REUS W/TWL XL LVL3 (GOWN DISPOSABLE) ×6 IMPLANT
KIT BASIN OR (CUSTOM PROCEDURE TRAY) ×3 IMPLANT
LUBRICANT JELLY K Y 4OZ (MISCELLANEOUS) ×3 IMPLANT
NEEDLE HYPO 25X1 1.5 SAFETY (NEEDLE) IMPLANT
PACK LITHOTOMY IV (CUSTOM PROCEDURE TRAY) ×3 IMPLANT
PAD ABD 7.5X8 STRL (GAUZE/BANDAGES/DRESSINGS) IMPLANT
PENCIL BUTTON HOLSTER BLD 10FT (ELECTRODE) ×3 IMPLANT
SOL PREP PROV IODINE SCRUB 4OZ (MISCELLANEOUS) ×3 IMPLANT
SPONGE GAUZE 4X4 12PLY (GAUZE/BANDAGES/DRESSINGS) ×3 IMPLANT
SWAB COLLECTION DEVICE MRSA (MISCELLANEOUS) IMPLANT
SYR CONTROL 10ML LL (SYRINGE) IMPLANT
TOWEL OR 17X26 10 PK STRL BLUE (TOWEL DISPOSABLE) ×3 IMPLANT
TOWEL OR NON WOVEN STRL DISP B (DISPOSABLE) ×3 IMPLANT
TUBE ANAEROBIC SPECIMEN COL (MISCELLANEOUS) ×3 IMPLANT
UNDERPAD 30X30 INCONTINENT (UNDERPADS AND DIAPERS) ×3 IMPLANT
YANKAUER SUCT BULB TIP 10FT TU (MISCELLANEOUS) ×3 IMPLANT

## 2013-09-30 NOTE — ED Notes (Signed)
MD at bedside. 

## 2013-09-30 NOTE — Transfer of Care (Signed)
Immediate Anesthesia Transfer of Care Note  Patient: Richard Huber  Procedure(s) Performed: Procedure(s): IRRIGATION AND DEBRIDEMENT PERIRECTAL ABSCESS (N/A)  Patient Location: PACU  Anesthesia Type:General  Level of Consciousness: awake, alert , oriented and patient cooperative  Airway & Oxygen Therapy: Patient Spontanous Breathing and Patient connected to face mask oxygen  Post-op Assessment: Report given to PACU RN and Post -op Vital signs reviewed and stable  Post vital signs: stable  Complications: No apparent anesthesia complications

## 2013-09-30 NOTE — ED Notes (Signed)
Patient transported to CT 

## 2013-09-30 NOTE — Care Management Note (Signed)
Cm spoke with patient at the bedside with family present concerning discharge planning. Pt states being employed, does not qualify for Medicaid benefits. Pt resides with brother. Pt agreeable to Cm providing information concerning [facility] and scheduling an appt with clinic prior to discharge. Pt informed eligible for MATCH assistance if discharged on non generic brand drugs.    Roxy Mannsymeeka Abubakar Crispo,MSN,RN [phone]

## 2013-09-30 NOTE — Anesthesia Postprocedure Evaluation (Signed)
  Anesthesia Post-op Note  Patient: Richard Huber  Procedure(s) Performed: Procedure(s) (LRB): IRRIGATION AND DEBRIDEMENT PERIRECTAL ABSCESS (N/A)  Patient Location: PACU  Anesthesia Type: General  Level of Consciousness: awake and alert   Airway and Oxygen Therapy: Patient Spontanous Breathing  Post-op Pain: mild  Post-op Assessment: Post-op Vital signs reviewed, Patient's Cardiovascular Status Stable, Respiratory Function Stable, Patent Airway and No signs of Nausea or vomiting  Last Vitals:  Filed Vitals:   09/30/13 1255  BP: 166/92  Pulse:   Temp: 37.1 C  Resp:     Post-op Vital Signs: stable   Complications: No apparent anesthesia complications

## 2013-09-30 NOTE — Anesthesia Preprocedure Evaluation (Signed)
Anesthesia Evaluation  Patient identified by MRN, date of birth, ID band Patient awake    Reviewed: Allergy & Precautions, H&P , NPO status , Patient's Chart, lab work & pertinent test results  Airway Mallampati: II  TM Distance: >3 FB Neck ROM: full    Dental no notable dental hx. (+) Teeth Intact, Dental Advisory Given   Pulmonary neg pulmonary ROS, Current Smoker,  breath sounds clear to auscultation  Pulmonary exam normal       Cardiovascular Exercise Tolerance: Good negative cardio ROS  Rhythm:regular Rate:Normal     Neuro/Psych negative neurological ROS  negative psych ROS   GI/Hepatic negative GI ROS, Neg liver ROS,   Endo/Other  negative endocrine ROS  Renal/GU negative Renal ROS  negative genitourinary   Musculoskeletal   Abdominal   Peds  Hematology negative hematology ROS (+)   Anesthesia Other Findings   Reproductive/Obstetrics negative OB ROS                            Anesthesia Physical Anesthesia Plan  ASA: II  Anesthesia Plan: General   Post-op Pain Management:    Induction: Intravenous  Airway Management Planned: Oral ETT  Additional Equipment:   Intra-op Plan:   Post-operative Plan: Extubation in OR  Informed Consent: I have reviewed the patients History and Physical, chart, labs and discussed the procedure including the risks, benefits and alternatives for the proposed anesthesia with the patient or authorized representative who has indicated his/her understanding and acceptance.   Dental Advisory Given  Plan Discussed with: CRNA and Surgeon  Anesthesia Plan Comments:         Anesthesia Quick Evaluation  

## 2013-09-30 NOTE — ED Notes (Signed)
Pt reports rectal pain since Sunday, reports previous hx of a peri-anal abscess in the past that was similar to this. Pt unable to sit d/t pain. Pt ambulatory to triage, a&o x4.

## 2013-09-30 NOTE — Brief Op Note (Signed)
09/30/2013  12:16 PM  PATIENT:  Richard Huber  28 y.o. male  PRE-OPERATIVE DIAGNOSIS:  PERIRECTAL ABCESS  POST-OPERATIVE DIAGNOSIS:  PERIRECTAL ABCESS  PROCEDURE:  Procedure(s): IRRIGATION AND DEBRIDEMENT PERIRECTAL ABSCESS (N/A)  SURGEON:  Surgeon(s) and Role:    * Velora Hecklerodd M Jahir Halt, MD - Primary  ANESTHESIA:   general  EBL:     BLOOD ADMINISTERED:none  DRAINS: none   LOCAL MEDICATIONS USED:  NONE  SPECIMEN:  Source of Specimen:  cultures of abscess  DISPOSITION OF SPECIMEN:  laboratory  COUNTS:  YES  TOURNIQUET:  * No tourniquets in log *  DICTATION: .Other Dictation: Dictation Number [id number]  PLAN OF CARE: Admit for overnight observation  PATIENT DISPOSITION:  PACU - hemodynamically stable.   Delay start of Pharmacological VTE agent (>24hrs) due to surgical blood loss or risk of bleeding: yes  Velora Hecklerodd M. Halynn Reitano, MD, [facility] Office: [phone]

## 2013-09-30 NOTE — H&P (Signed)
Richard Huber Jun 16, 1986  [id number].   Primary Care MD: none Chief Complaint/Reason for Consult: perirectal abscess HPI: this is a 28 yo black male who has a history of a prior perirectal abscess that required I&Dx2 3 years ago.  This one started about 4 days ago.  It has continued to get bigger and has not responded to anything he has tried to get it better.  He admits to subjective fevers and sweats.  He states that it feels like as this has worsened that his testicles have retracted back up into him and he gets a tingling sensation when voiding.  Other than that, he just has pain.  He presented to [facility] for further evaluation.  He had a CT of his pelvis that reveals a perirectal abscess.  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  We have been asked to see him for further evaluation.  ROS : Please see HPI, otherwise all other systems are negative except for no BM in 3-4 days due to rectal pain  History reviewed. No pertinent family history.  Past Medical History  Diagnosis Date  . Perirectal abscess   . Pilonidal cyst     Past Surgical History  Procedure Laterality Date  . Incision and drainage of perirectal abscess      x2    Social History:  reports that he has been smoking Cigarettes.  He has been smoking about 0.25 packs per day. He has never used smokeless tobacco. He reports that he drinks alcohol. He reports that he uses illicit drugs (Marijuana).  Allergies: No Known Allergies   (Not in a hospital admission)  Blood pressure 148/84, pulse 90, temperature 98.6 F (37 C), temperature source Oral, resp. rate 20, height 6' 4"  (1.93 m), weight 295 lb (133.811 kg), SpO2 100.00%. Physical Exam: General: pleasant, WD, WN black male who is laying in bed in NAD HEENT: head is normocephalic, atraumatic.  Sclera are noninjected.  PERRL.  Ears and nose without any masses or lesions.  Mouth is pink and moist Heart: regular, rate, and rhythm.  Normal  s1,s2. No obvious murmurs, gallops, or rubs noted.  Palpable radial and pedal pulses bilaterally Lungs: CTAB, no wheezes, rhonchi, or rales noted.  Respiratory effort nonlabored Abd: soft, NT, ND, +BS, no masses, hernias, or organomegaly MS: all 4 extremities are symmetrical with no cyanosis, clubbing, or edema. Skin: warm and dry with copious tattoos.  His left buttock reveals a large area of induration approximately 5x5cm in area.  The medial portion of this induration reveals a fluctuant area.  He is so tender it is difficult to obtain a thorough exam. Psych: A&Ox3 with an appropriate affect.    Results for orders placed during the hospital encounter of 09/30/13 (from the past 48 hour(s))  URINALYSIS, ROUTINE W REFLEX MICROSCOPIC     Status: Abnormal   Collection Time    09/30/13  1:57 AM      Result Value Ref Range   Color, Urine YELLOW  YELLOW   APPearance CLEAR  CLEAR   Specific Gravity, Urine 1.019  1.005 - 1.030   pH 5.5  5.0 - 8.0   Glucose, UA NEGATIVE  NEGATIVE mg/dL   Hgb urine dipstick TRACE (*) NEGATIVE   Bilirubin Urine NEGATIVE  NEGATIVE   Ketones, ur NEGATIVE  NEGATIVE mg/dL   Protein, ur 30 (*) NEGATIVE mg/dL   Urobilinogen, UA 0.2  0.0 - 1.0 mg/dL   Nitrite NEGATIVE  NEGATIVE  Leukocytes, UA NEGATIVE  NEGATIVE  URINE MICROSCOPIC-ADD ON     Status: Abnormal   Collection Time    09/30/13  1:57 AM      Result Value Ref Range   WBC, UA 0-2  <3 WBC/hpf   RBC / HPF 0-2  <3 RBC/hpf   Casts WBC CAST (*) NEGATIVE  CBC WITH DIFFERENTIAL     Status: Abnormal   Collection Time    09/30/13  3:15 AM      Result Value Ref Range   WBC 20.2 (*) 4.0 - 10.5 K/uL   RBC 5.88 (*) 4.22 - 5.81 MIL/uL   Hemoglobin 14.2  13.0 - 17.0 g/dL   HCT 43.1  39.0 - 52.0 %   MCV 73.3 (*) 78.0 - 100.0 fL   MCH 24.1 (*) 26.0 - 34.0 pg   MCHC 32.9  30.0 - 36.0 g/dL   RDW 13.2  11.5 - 15.5 %   Platelets 344  150 - 400 K/uL   Neutrophils Relative % 75  43 - 77 %   Neutro Abs 15.2 (*) 1.7 -  7.7 K/uL   Lymphocytes Relative 13  12 - 46 %   Lymphs Abs 2.6  0.7 - 4.0 K/uL   Monocytes Relative 10  3 - 12 %   Monocytes Absolute 2.0 (*) 0.1 - 1.0 K/uL   Eosinophils Relative 2  0 - 5 %   Eosinophils Absolute 0.4  0.0 - 0.7 K/uL   Basophils Relative 0  0 - 1 %   Basophils Absolute 0.0  0.0 - 0.1 K/uL  BASIC METABOLIC PANEL     Status: Abnormal   Collection Time    09/30/13  3:15 AM      Result Value Ref Range   Sodium 140  137 - 147 mEq/L   Potassium 4.1  3.7 - 5.3 mEq/L   Chloride 102  96 - 112 mEq/L   CO2 22  19 - 32 mEq/L   Glucose, Bld 99  70 - 99 mg/dL   BUN 12  6 - 23 mg/dL   Creatinine, Ser 1.30  0.50 - 1.35 mg/dL   Calcium 9.9  8.4 - 10.5 mg/dL   GFR calc non Af Amer 75 (*) >90 mL/min   GFR calc Af Amer 87 (*) >90 mL/min   Comment: (NOTE)     The eGFR has been calculated using the CKD EPI equation.     This calculation has not been validated in all clinical situations.     eGFR's persistently <90 mL/min signify possible Chronic Kidney     Disease.   Ct Abdomen Pelvis W Contrast  09/30/2013   CLINICAL DATA:  Rectal pain.  History of perirectal abscess.  EXAM: CT ABDOMEN AND PELVIS WITH CONTRAST  TECHNIQUE: Multidetector CT imaging of the abdomen and pelvis was performed using the standard protocol following bolus administration of intravenous contrast.  CONTRAST:  133m OMNIPAQUE IOHEXOL 300 MG/ML  SOLN  COMPARISON:  None.  FINDINGS: BODY WALL: There is a fluid collection in the left perianal region measuring 4.5 x 1.8 x 3 cm (craniocaudal underestimated due to incomplete imaging). Linear fat infiltration in the bilateral perianal region may reflect fistulae. There is also subcutaneous fat infiltration left of the upper gluteal cleft, measuring 2.5 cm in diameter. This appears to have surrounding inflammation and central low-attenuation. No supralevator involvement. Bilateral inguinal lymphadenopathy considered reactive.  LOWER CHEST: Unremarkable.  ABDOMEN/PELVIS:  Liver: No  focal abnormality.  Biliary: No evidence of  biliary obstruction or stone.  Pancreas: Unremarkable.  Spleen: Unremarkable.  Adrenals: Unremarkable.  Kidneys and ureters: No hydronephrosis or stone.  Bladder: Unremarkable.  Reproductive: Unremarkable.  Bowel: No obstruction. Normal appendix.  Retroperitoneum: No mass or adenopathy.  Peritoneum: No free fluid or gas.  Vascular: No acute abnormality.  OSSEOUS: L5 pars defects bilaterally, with mild slip.  IMPRESSION: 1. 4.5 x 2 x 3 cm left perianal abscess. No supralevator involvement. 2. There may be perirectal fistula, which would be better assessed by MRI if clinically needed. 3. 2.5 cm pilonidal cyst/ abscess at the upper gluteal cleft.   Electronically Signed   By: Jorje Guild M.D.   On: 09/30/2013 05:29       Assessment/Plan 1. Perirectal abscess 2. Chronic pilonidal cyst  Plan: 1. Will admit and plan on OR today for I&D of perirectal abscess 2. Cont zosyn.  No concerns right now for adding vancomycin 3. D/w the patient he could follow up electively to discuss excision of his pilonidal cyst for resolution of this chronic problem. 4. Keep NPO  Ashritha Desrosiers E 09/30/2013, 8:02 AM Pager: [phone]

## 2013-09-30 NOTE — ED Provider Notes (Signed)
CSN: [id number]     Arrival date & time 09/30/13  0115 History   First MD Initiated Contact with Patient 09/30/13 0244     Chief Complaint  Patient presents with  . Rectal Pain     (Consider location/radiation/quality/duration/timing/severity/associated sxs/prior Treatment) Patient is a 28 y.o. male presenting with male genitourinary complaint. The history is provided by the patient.  Male GU Problem Presenting symptoms: no dysuria   Presenting symptoms comment:  Rectal pain  Relieved by:  Nothing Worsened by:  Movement and tactile pressure (BM) Ineffective treatments:  None tried Associated symptoms: fever   Associated symptoms: no abdominal pain, no diarrhea, no genital itching, no genital lesions, no genital rash, no nausea, no penile swelling, no scrotal swelling, no urinary frequency and no vomiting   Fever:    Duration:  1 hour   Timing:  Intermittent   Temp source:  Subjective Risk factors comment:  Recurrent perirecal abscesses   Past Medical History  Diagnosis Date  . Boil    History reviewed. No pertinent past surgical history. History reviewed. No pertinent family history. History  Substance Use Topics  . Smoking status: Current Every Day Smoker  . Smokeless tobacco: Never Used  . Alcohol Use: Yes    Review of Systems  Constitutional: Positive for fever. Negative for activity change, appetite change and fatigue.  HENT: Negative for congestion, facial swelling, rhinorrhea and trouble swallowing.   Eyes: Negative for photophobia and pain.  Respiratory: Negative for cough, chest tightness and shortness of breath.   Cardiovascular: Negative for chest pain and leg swelling.  Gastrointestinal: Negative for nausea, vomiting, abdominal pain, diarrhea and constipation.  Endocrine: Negative for polydipsia and polyuria.  Genitourinary: Negative for dysuria, urgency, frequency, decreased urine volume, penile swelling, scrotal swelling and difficulty urinating.    Musculoskeletal: Negative for back pain and gait problem.  Skin: Negative for color change, rash and wound.  Allergic/Immunologic: Negative for immunocompromised state.  Neurological: Negative for dizziness, facial asymmetry, speech difficulty, weakness, numbness and headaches.  Psychiatric/Behavioral: Negative for confusion, decreased concentration and agitation.      Allergies  Review of patient's allergies indicates no known allergies.  Home Medications   Current Outpatient Rx  Name  Route  Sig  Dispense  Refill  . ibuprofen (ADVIL,MOTRIN) 200 MG tablet   Oral   Take 1,000 mg by mouth every 6 (six) hours as needed for fever, headache or moderate pain.          Marland Kitchen doxycycline (VIBRAMYCIN) 100 MG capsule   Oral   Take 1 capsule (100 mg total) by mouth 2 (two) times daily. One po bid x 7 days   14 capsule   0    BP 148/84  Pulse 90  Temp(Src) 98.6 F (37 C) (Oral)  Resp 20  Ht 6\' 4"  (1.93 m)  Wt 295 lb (133.811 kg)  BMI 35.92 kg/m2  SpO2 100% Physical Exam  Constitutional: He is oriented to person, place, and time. He appears well-developed and well-nourished. No distress.  HENT:  Head: Normocephalic and atraumatic.  Mouth/Throat: No oropharyngeal exudate.  Eyes: Pupils are equal, round, and reactive to light.  Neck: Normal range of motion. Neck supple.  Cardiovascular: Normal rate, regular rhythm and normal heart sounds.  Exam reveals no gallop and no friction rub.   No murmur heard. Pulmonary/Chest: Effort normal and breath sounds normal. No respiratory distress. He has no wheezes. He has no rales.  Abdominal: Soft. Bowel sounds are normal. He exhibits no distension  and no mass. There is no tenderness. There is no rebound and no guarding.  Genitourinary:     Musculoskeletal: Normal range of motion. He exhibits no edema and no tenderness.  Neurological: He is alert and oriented to person, place, and time.  Skin: Skin is warm and dry.  Psychiatric: He has a  normal mood and affect.    ED Course  Procedures (including critical care time) Labs Review Labs Reviewed  URINALYSIS, ROUTINE W REFLEX MICROSCOPIC - Abnormal; Notable for the following:    Hgb urine dipstick TRACE (*)    Protein, ur 30 (*)    All other components within normal limits  CBC WITH DIFFERENTIAL - Abnormal; Notable for the following:    WBC 20.2 (*)    RBC 5.88 (*)    MCV 73.3 (*)    MCH 24.1 (*)    Neutro Abs 15.2 (*)    Monocytes Absolute 2.0 (*)    All other components within normal limits  BASIC METABOLIC PANEL - Abnormal; Notable for the following:    GFR calc non Af Amer 75 (*)    GFR calc Af Amer 87 (*)    All other components within normal limits  URINE MICROSCOPIC-ADD ON - Abnormal; Notable for the following:    Casts WBC CAST (*)    All other components within normal limits   Imaging Review Ct Abdomen Pelvis W Contrast  09/30/2013   CLINICAL DATA:  Rectal pain.  History of perirectal abscess.  EXAM: CT ABDOMEN AND PELVIS WITH CONTRAST  TECHNIQUE: Multidetector CT imaging of the abdomen and pelvis was performed using the standard protocol following bolus administration of intravenous contrast.  CONTRAST:  OMNIPAQUE IOHEXOL 300 MG/ML  SOLN  COMPARISON:  None.  FINDINGS: BODY WALL: There is a fluid collection in the left perianal region measuring 4.5 x 1.8 x 3 cm (craniocaudal underestimated due to incomplete imaging). Linear fat infiltration in the bilateral perianal region may reflect fistulae. There is also subcutaneous fat infiltration left of the upper gluteal cleft, measuring 2.5 cm in diameter. This appears to have surrounding inflammation and central low-attenuation. No supralevator involvement. Bilateral inguinal lymphadenopathy considered reactive.  LOWER CHEST: Unremarkable.  ABDOMEN/PELVIS:  Liver: No focal abnormality.  Biliary: No evidence of biliary obstruction or stone.  Pancreas: Unremarkable.  Spleen: Unremarkable.  Adrenals: Unremarkable.   Kidneys and ureters: No hydronephrosis or stone.  Bladder: Unremarkable.  Reproductive: Unremarkable.  Bowel: No obstruction. Normal appendix.  Retroperitoneum: No mass or adenopathy.  Peritoneum: No free fluid or gas.  Vascular: No acute abnormality.  OSSEOUS: L5 pars defects bilaterally, with mild slip.  IMPRESSION: 1. 4.5 x 2 x 3 cm left perianal abscess. No supralevator involvement. 2. There may be perirectal fistula, which would be better assessed by MRI if clinically needed. 3. 2.5 cm pilonidal cyst/ abscess at the upper gluteal cleft.   Electronically Signed   By: Tiburcio Pea M.D.   On: 09/30/2013 05:29     EKG Interpretation None      MDM   Final diagnoses:  Perianal abscess  Perirectal fistula    Pt is a 28 y.o. male with Pmhx as above including recurrent perirectal abcesses who presents with 2-3 days worsening rectal pain, now with subjective fever chills. Unable to examine gluteal cleft due to severe pain. Will treat pain, get CT pelvis as pain out of propotion on exam and attempt to reexamine.   On reexamination, pt has large very tender indurated area to left gluteus,  but still unable to clearly examine rectum. WBC 20.  CT pelvis with 4.5 x 2 x 3 cm left perianal abscess, perirectal fistula.  Gen surgery consulted and will be seen in the ED. IV zosyn given.         Shanna CiscoMegan E Elnita Surprenant, MD 09/30/13 0730

## 2013-09-30 NOTE — H&P (Signed)
General Surgery [facility]  Patient seen and examined.  Discussed exam under anesthesia with drainage and open packing of abscess.  He understands and agrees to proceed.  Velora Hecklerodd M. Dovid Bartko, MD, [facility] Office: [phone]

## 2013-10-01 ENCOUNTER — Encounter (HOSPITAL_COMMUNITY): Payer: Self-pay | Admitting: Surgery

## 2013-10-01 MED ORDER — OXYCODONE-ACETAMINOPHEN 5-325 MG PO TABS
1.0000 | ORAL_TABLET | ORAL | Status: DC | PRN
  Administered 2013-10-02: 2 via ORAL
  Filled 2013-10-01: qty 2

## 2013-10-01 MED ORDER — SENNOSIDES-DOCUSATE SODIUM 8.6-50 MG PO TABS
1.0000 | ORAL_TABLET | Freq: Two times a day (BID) | ORAL | Status: DC
  Administered 2013-10-01 – 2013-10-02 (×3): 1 via ORAL
  Filled 2013-10-01 (×5): qty 1

## 2013-10-01 NOTE — Progress Notes (Signed)
General Surgery [facility]  Agree.  Wound care as instructed.  Pain control.  Home when pain controlled and dressing changes tolerated.  Velora Hecklerodd M. Vedanth Sirico, MD, [facility] Office: [phone]

## 2013-10-01 NOTE — Progress Notes (Signed)
Patient ID: Richard Huber, male   DOB: November 10, 1985, 28 y.o.   MRN: [id number]  Subjective: Could not tolerate dressing change with oral pain meds.  Objective:  Vital signs:  Filed Vitals:   09/30/13 2003 10/01/13 0242 10/01/13 0445 10/01/13 0959  BP: 136/74 104/55 127/75 136/63  Pulse: 91 74 64 54  Temp: 98.5 F (36.9 C) 98.4 F (36.9 C) 97.6 F (36.4 C) 98.3 F (36.8 C)  TempSrc: Oral Oral Oral Oral  Resp: 16 16 16 16   Height:      Weight:      SpO2: 98% 99% 100% 100%    Last BM Date: 09/29/13  Intake/Output   Yesterday:  03/04 0701 - 03/05 0700 In: 1390 [P.O.:240; I.V.:1150] Out: 950 [Urine:950] This shift:  Total I/O In: -  Out: 250 [Urine:250]   Physical Exam: General: Pt awake/alert/oriented x3 in no acute distress Chest: CTA bilaterally.  No chest wall pain w good excursion CV:  Pulses intact.  Regular rhythm MS: Normal AROM mjr joints.  No obvious deformity Abdomen: Soft.  Nondistended. non tender.  No evidence of peritonitis.  No incarcerated hernias. Ext:  SCDs BLE.  No mjr edema.  No cyanosis Skin: left perirectal wound-clean, no erythema, some induration   Problem List:   Principal Problem:   Perirectal abscess Active Problems:   Pilonidal cyst   Marijuana use    Results:   Labs: Results for orders placed during the hospital encounter of 09/30/13 (from the past 48 hour(s))  URINALYSIS, ROUTINE W REFLEX MICROSCOPIC     Status: Abnormal   Collection Time    09/30/13  1:57 AM      Result Value Ref Range   Color, Urine YELLOW  YELLOW   APPearance CLEAR  CLEAR   Specific Gravity, Urine 1.019  1.005 - 1.030   pH 5.5  5.0 - 8.0   Glucose, UA NEGATIVE  NEGATIVE mg/dL   Hgb urine dipstick TRACE (*) NEGATIVE   Bilirubin Urine NEGATIVE  NEGATIVE   Ketones, ur NEGATIVE  NEGATIVE mg/dL   Protein, ur 30 (*) NEGATIVE mg/dL   Urobilinogen, UA 0.2  0.0 - 1.0 mg/dL   Nitrite NEGATIVE  NEGATIVE   Leukocytes, UA NEGATIVE  NEGATIVE  URINE  MICROSCOPIC-ADD ON     Status: Abnormal   Collection Time    09/30/13  1:57 AM      Result Value Ref Range   WBC, UA 0-2  <3 WBC/hpf   RBC / HPF 0-2  <3 RBC/hpf   Casts WBC CAST (*) NEGATIVE  CBC WITH DIFFERENTIAL     Status: Abnormal   Collection Time    09/30/13  3:15 AM      Result Value Ref Range   WBC 20.2 (*) 4.0 - 10.5 K/uL   RBC 5.88 (*) 4.22 - 5.81 MIL/uL   Hemoglobin 14.2  13.0 - 17.0 g/dL   HCT 43.1  39.0 - 52.0 %   MCV 73.3 (*) 78.0 - 100.0 fL   MCH 24.1 (*) 26.0 - 34.0 pg   MCHC 32.9  30.0 - 36.0 g/dL   RDW 13.2  11.5 - 15.5 %   Platelets 344  150 - 400 K/uL   Neutrophils Relative % 75  43 - 77 %   Neutro Abs 15.2 (*) 1.7 - 7.7 K/uL   Lymphocytes Relative 13  12 - 46 %   Lymphs Abs 2.6  0.7 - 4.0 K/uL   Monocytes Relative 10  3 - 12 %  Monocytes Absolute 2.0 (*) 0.1 - 1.0 K/uL   Eosinophils Relative 2  0 - 5 %   Eosinophils Absolute 0.4  0.0 - 0.7 K/uL   Basophils Relative 0  0 - 1 %   Basophils Absolute 0.0  0.0 - 0.1 K/uL  BASIC METABOLIC PANEL     Status: Abnormal   Collection Time    09/30/13  3:15 AM      Result Value Ref Range   Sodium 140  137 - 147 mEq/L   Potassium 4.1  3.7 - 5.3 mEq/L   Chloride 102  96 - 112 mEq/L   CO2 22  19 - 32 mEq/L   Glucose, Bld 99  70 - 99 mg/dL   BUN 12  6 - 23 mg/dL   Creatinine, Ser 1.30  0.50 - 1.35 mg/dL   Calcium 9.9  8.4 - 10.5 mg/dL   GFR calc non Af Amer 75 (*) >90 mL/min   GFR calc Af Amer 87 (*) >90 mL/min   Comment: (NOTE)     The eGFR has been calculated using the CKD EPI equation.     This calculation has not been validated in all clinical situations.     eGFR's persistently <90 mL/min signify possible Chronic Kidney     Disease.  SURGICAL PCR SCREEN     Status: None   Collection Time    09/30/13  9:48 AM      Result Value Ref Range   MRSA, PCR NEGATIVE  NEGATIVE   Staphylococcus aureus NEGATIVE  NEGATIVE   Comment:            The Xpert SA Assay (FDA     approved for NASAL specimens     in  patients over 72 years of age),     is one component of     a comprehensive surveillance     program.  Test performance has     been validated by [employer] for patients greater     than or equal to 43 year old.     It is not intended     to diagnose infection nor to     guide or monitor treatment.  CULTURE, ROUTINE-ABSCESS     Status: None   Collection Time    09/30/13 12:05 PM      Result Value Ref Range   Specimen Description ABSCESS PERIRECTAL     Special Requests PATIENT ON FOLLOWING ZOSYN     Gram Stain       Value: FEW WBC PRESENT,BOTH PMN AND MONONUCLEAR     NO SQUAMOUS EPITHELIAL CELLS SEEN     RARE GRAM POSITIVE COCCI     IN PAIRS     Performed at [facility]   Culture       Value: NO GROWTH 1 DAY     Performed at [facility]   Report Status PENDING    ANAEROBIC CULTURE     Status: None   Collection Time    09/30/13 12:05 PM      Result Value Ref Range   Specimen Description ABSCESS PERIRECTAL     Special Requests PATIENT ON FOLLOWING ZOSYN     Gram Stain       Value: MODERATE WBC PRESENT,BOTH PMN AND MONONUCLEAR     NO SQUAMOUS EPITHELIAL CELLS SEEN     FEW GRAM POSITIVE COCCI     IN PAIRS     Performed at [facility]   Culture PENDING     Report Status PENDING      Imaging / Studies: Ct Abdomen Pelvis W Contrast  09/30/2013   CLINICAL DATA:  Rectal pain.  History of perirectal abscess.  EXAM: CT ABDOMEN AND PELVIS WITH CONTRAST  TECHNIQUE: Multidetector CT imaging of the abdomen and pelvis was performed using the standard protocol following bolus administration of intravenous contrast.  CONTRAST:  161m OMNIPAQUE IOHEXOL 300 MG/ML  SOLN  COMPARISON:  None.  FINDINGS: BODY WALL: There is a fluid collection in the left perianal region measuring 4.5 x 1.8 x 3 cm (craniocaudal underestimated due to incomplete imaging). Linear fat infiltration in the bilateral perianal region may reflect fistulae. There is also subcutaneous fat  infiltration left of the upper gluteal cleft, measuring 2.5 cm in diameter. This appears to have surrounding inflammation and central low-attenuation. No supralevator involvement. Bilateral inguinal lymphadenopathy considered reactive.  LOWER CHEST: Unremarkable.  ABDOMEN/PELVIS:  Liver: No focal abnormality.  Biliary: No evidence of biliary obstruction or stone.  Pancreas: Unremarkable.  Spleen: Unremarkable.  Adrenals: Unremarkable.  Kidneys and ureters: No hydronephrosis or stone.  Bladder: Unremarkable.  Reproductive: Unremarkable.  Bowel: No obstruction. Normal appendix.  Retroperitoneum: No mass or adenopathy.  Peritoneum: No free fluid or gas.  Vascular: No acute abnormality.  OSSEOUS: L5 pars defects bilaterally, with mild slip.  IMPRESSION: 1. 4.5 x 2 x 3 cm left perianal abscess. No supralevator involvement. 2. There may be perirectal fistula, which would be better assessed by MRI if clinically needed. 3. 2.5 cm pilonidal cyst/ abscess at the upper gluteal cleft.   Electronically Signed   By: JJorje GuildM.D.   On: 09/30/2013 05:29    Medications / Allergies: per chart  Antibiotics: Anti-infectives   Start     Dose/Rate Route Frequency Ordered Stop   09/30/13 0800  piperacillin-tazobactam (ZOSYN) IVPB 3.375 g     3.375 g 12.5 mL/hr over 240 Minutes Intravenous 3 times per day 09/30/13 0813     09/30/13 0645  piperacillin-tazobactam (ZOSYN) IVPB 3.375 g     3.375 g 100 mL/hr over 30 Minutes Intravenous  Once 09/30/13 0518903/04/15 0734      Assessment/Plan POD#1 I&D of perirectal abscess -Hold off on discharge until pain improves with dressing changes -BID wet to dry dressing changes with 1/2 packing.   -Sitz baths BID And PRN -add colace -saline lock IV -continue with IV antibiotics, change to PO at discharge -anticipate discharge tomorrow if pain is better controlled.    EErby Pian AYoakum [facility] HospitalSurgery Pager [phone] [phone] 10/01/2013  11:33 AM

## 2013-10-01 NOTE — Op Note (Signed)
NAMDeforest Hoyles:  Richard Huber, Richard Huber          ACCOUNT NO.:  [id number]  MEDICAL RECORD NO.:  [id number]  LOCATION:  1319                         [facility]  PHYSICIAN:  Velora Hecklerodd M Shivali Quackenbush, MD      DATE OF BIRTH:  02/05/1986  DATE OF PROCEDURE:  09/30/2013                              OPERATIVE REPORT   PREOPERATIVE DIAGNOSIS:  Perirectal abscess.  POSTOPERATIVE DIAGNOSIS:  Perirectal abscess.  PROCEDURE:  Incision and drainage and open packing of perirectal abscess.  SURGEON:  Velora Hecklerodd M Hermes Wafer, MD, FACS  ANESTHESIA:  General per Dr. Ronelle Nighharles Ewell  ESTIMATED BLOOD LOSS:  Minimal.  PREPARATION:  Betadine.  COMPLICATIONS:  None.  INDICATIONS:  The patient is a 28 year old male with a history of perirectal abscess treated in 2012.  The patient presented to the emergency department today with 4-day history of pain and swelling.  CT scan confirmed the presence of a 4.5 cm abscess.  The patient now comes to Surgery for incision and drainage under anesthesia.  BODY OF REPORT:  [facility].  The patient was brought to the operating room and placed in supine position on the operating room table.  Following administration of general anesthesia, the patient was positioned in lithotomy and prepped and draped in the usual aseptic fashion.  After ascertaining that an adequate level of anesthesia had been achieved, an elliptical incision was made with the electrocautery over the area of fluctuance in the left anterior buttock.  Abscess cavity was opened containing frankly purulent fluid, approximately 30-40 mL.  Aerobic and anaerobic cultures were submitted.  Cavity was gently explored with digital examination and loculations broken up.  It was irrigated with warm saline.  Hemostasis was achieved with the electrocautery.  The cavity was then packed thoroughly with 0.5 inch iodoform gauze packing.  Dry gauze dressings and ABD pads were placed.  The  patient was awakened from anesthesia and brought to the recovery room.  The patient tolerated the procedure well.   Velora Hecklerodd M. Domonik Levario, MD, [facility] Office: [phone]    TMG/MEDQ  D:  09/30/2013  T:  10/01/2013  Job:  [id number]

## 2013-10-01 NOTE — Care Management Note (Signed)
Cm consult for Home Health Needs. Pt is self-pay. [facility] liaison Judeth CornfieldStephanie notified of home health needs. PCP establishment and orange card eligibility appointment scheduled for patient on 10/08/13 @ 11 am with Dr. Susie CassetteAbrol. Appointment placed on discharge instructions.     Roxy Mannsymeeka Servando Kyllonen,MSN,RN [phone]

## 2013-10-01 NOTE — Progress Notes (Signed)
UR completed. Patient changed to inpatient- requiring IV antibiotics and IV pain medication for dressing changes

## 2013-10-02 LAB — CULTURE, ROUTINE-ABSCESS

## 2013-10-02 MED ORDER — SENNOSIDES-DOCUSATE SODIUM 8.6-50 MG PO TABS: 1.0000 | ORAL_TABLET | Freq: Two times a day (BID) | ORAL | Status: DC

## 2013-10-02 MED ORDER — AMOXICILLIN-POT CLAVULANATE 875-125 MG PO TABS: 1.0000 | ORAL_TABLET | Freq: Two times a day (BID) | ORAL | Status: DC

## 2013-10-02 MED ORDER — OXYCODONE-ACETAMINOPHEN 5-325 MG PO TABS: 1.0000 | ORAL_TABLET | ORAL | Status: DC | PRN

## 2013-10-02 NOTE — Progress Notes (Signed)
2 Days Post-Op  Subjective: Better with dressing change last night.  Ready to go home.  Afebrile VSS.    Objective: Vital signs in last 24 hours: Temp:  [98.2 F (36.8 C)-98.3 F (36.8 C)] 98.2 F (36.8 C) (03/06 0527) Pulse Rate:  [54-72] 64 (03/06 0527) Resp:  [16] 16 (03/06 0527) BP: (136-145)/(63-74) 138/74 mmHg (03/06 0527) SpO2:  [97 %-100 %] 97 % (03/06 0527) Last BM Date: 09/29/13  Intake/Output from previous day: 03/05 0701 - 03/06 0700 In: 800 [P.O.:800] Out: 1050 [Urine:1050] Intake/Output this shift:   PE General appearance: alert, cooperative and no distress GI: soft, non-tender; bowel sounds normal; no masses,  no organomegaly Skin: Skin color, texture, turgor normal. No rashes or lesions or dresisng is c/d/i  Lab Results:   Recent Labs  09/30/13 0315  WBC 20.2*  HGB 14.2  HCT 43.1  PLT 344   BMET  Recent Labs  09/30/13 0315  NA 140  K 4.1  CL 102  CO2 22  GLUCOSE 99  BUN 12  CREATININE 1.30  CALCIUM 9.9   PT/INR No results found for this basename: LABPROT, INR,  in the last 72 hours ABG No results found for this basename: PHART, PCO2, PO2, HCO3,  in the last 72 hours  Studies/Results: No results found.  Anti-infectives: Anti-infectives   Start     Dose/Rate Route Frequency Ordered Stop   09/30/13 0800  piperacillin-tazobactam (ZOSYN) IVPB 3.375 g     3.375 g 12.5 mL/hr over 240 Minutes Intravenous 3 times per day 09/30/13 0813     09/30/13 0645  piperacillin-tazobactam (ZOSYN) IVPB 3.375 g     3.375 g 100 mL/hr over 30 Minutes Intravenous  Once 09/30/13 [id number] 09/30/13 0734      Assessment/Plan: POD#1 I&D of perirectal abscess  -unable to afford home health, brother and significant other to help with dressing changes.  Will instruct his brother this morning when he arrives.  Premedicate with oral pain medication.   -BID wet to dry dressing changes with 1/2 packing.  -Sitz baths BID And PRN  -discharge home today with 4 days of  Augmentin.     LOS: 2 days    Bonner PunaRIEBOCK, [facility] Center[facility]  ANP-BC Pager [phone]  10/02/2013 8:30 AM

## 2013-10-02 NOTE — Discharge Summary (Signed)
Physician Discharge Summary  Richard Huber WUJ:[id number] DOB: [birth year] DOA: 09/30/2013  PCP: No PCP Per Patient  Consultation: none  Admit date: 09/30/2013 Discharge date: 10/02/2013  Recommendations for Outpatient Follow-up:   Follow-up Information   Follow up with [facility]     On 10/08/2013. (@ 11am with Dr.Abrol and appt for ornage insurance card@ 12 noon)    Contact information:   [address] [place] [address] [phone]      Follow up with [facility].   Contact information:   [address] [place] [address] [phone]       Follow up with Vanita Panda., MD In 1 week.   Specialty:  General Surgery   Contact information:   [address] [place] [place] [address] [phone]       Follow up with [facility]. (Wound care)    Contact information:   [address] [place] [address] [phone]      Discharge Diagnoses:  1. Perirectal abscess    Surgical Procedure: incision and drainage 3/4 Dr. Eligha Bridegroom  Discharge Condition: stable Disposition: home  Diet recommendation: high fiber  Filed Weights   09/30/13 0119 09/30/13 0840  Weight: 295 lb (133.811 kg) 292 lb 12.8 oz (132.813 kg)     Filed Vitals:   10/02/13 0527  BP: 138/74  Pulse: 64  Temp: 98.2 F (36.8 C)  Resp: 16     Hospital Course:  The patient presented to [facility] with a perirectal abscess which he has had in previous years requiring I&D x2 episodes.  Her underwent the procedure listed above.  On POD#1 the patient was experiencing pain during dressing changes, requiring IV pain medication and was therefore kept for an additional day, medication were changed.  On POD#2 the patient tolerated the dressing changes, ambulating, afebrile, wound was clean and therefore felt stable for discharge.  He is to follow up with Dr. Maisie Fus per Dr. Gerrit Friends for suspicion of a fistula given  this is his 3rd episode.  The patient verbalizes understanding.  Home health will manage dressing change once daily and his brother Samson Frederic will do the night time dressing.  He was instructed on dressing changes.  He was provided with Rx for augmentin x5 more days along with percocet.  Sitz baths daily and PRN.  He was encouraged to call with questions or concerns.     Discharge Instructions   Future Appointments Provider Department Dept Phone   10/08/2013 11:00 AM [facility] Covering Provider [facility] [phone]   10/08/2013 12:00 PM [facility] Financial Counselor [facility] [phone]       Medication List    STOP taking these medications       doxycycline 100 MG capsule  Commonly known as:  VIBRAMYCIN      TAKE these medications       amoxicillin-clavulanate 875-125 MG per tablet  Commonly known as:  AUGMENTIN  Take 1 tablet by mouth 2 (two) times daily.     ibuprofen 200 MG tablet  Commonly known as:  ADVIL,MOTRIN  Take 1,000 mg by mouth every 6 (six) hours as needed for fever, headache or moderate pain.     oxyCODONE-acetaminophen 5-325 MG per tablet  Commonly known as:  PERCOCET/ROXICET  Take 1-2 tablets by mouth every 4 (four) hours as needed for severe pain.     senna-docusate 8.6-50 MG per tablet  Commonly known as:  Senokot-S  Take 1 tablet by mouth 2 (two) times daily.           Follow-up Information   Follow up with [facility]     On 10/08/2013. (@ 11am with Dr.Abrol and appt for ornage insurance card@ 12 noon)    Contact information:   [address] [place] [address] [phone]      Follow up with [facility].   Contact information:   [address] [place] [address] [phone]       Follow up with Vanita Panda., MD In 1 week.   Specialty:  General Surgery   Contact information:   [address] [place] [place] [address] [phone]       Follow up with [facility]. (Wound care)    Contact information:   [address] [place] [address] [phone]        The results of significant diagnostics from this hospitalization (including imaging, microbiology, ancillary and laboratory) are listed below for reference.    Significant Diagnostic Studies: Ct Abdomen Pelvis W Contrast  09/30/2013   CLINICAL DATA:  Rectal pain.  History of perirectal abscess.  EXAM: CT ABDOMEN AND PELVIS WITH CONTRAST  TECHNIQUE: Multidetector CT imaging of the abdomen and pelvis was performed using the standard protocol following bolus administration of intravenous contrast.  CONTRAST:  OMNIPAQUE IOHEXOL 300 MG/ML  SOLN  COMPARISON:  None.  FINDINGS: BODY WALL: There is a fluid collection in the left perianal region measuring 4.5 x 1.8 x 3 cm (craniocaudal underestimated due to incomplete imaging). Linear fat infiltration in the bilateral perianal region may reflect fistulae. There is also subcutaneous fat infiltration left of the upper gluteal cleft, measuring 2.5 cm in diameter. This appears to have surrounding inflammation and central low-attenuation. No supralevator involvement. Bilateral inguinal lymphadenopathy considered reactive.  LOWER CHEST: Unremarkable.  ABDOMEN/PELVIS:  Liver: No focal abnormality.  Biliary: No evidence of biliary obstruction or stone.  Pancreas: Unremarkable.  Spleen: Unremarkable.  Adrenals: Unremarkable.  Kidneys and ureters: No hydronephrosis or stone.  Bladder: Unremarkable.  Reproductive: Unremarkable.  Bowel: No obstruction. Normal appendix.  Retroperitoneum: No mass or adenopathy.  Peritoneum: No free fluid or gas.  Vascular: No acute abnormality.  OSSEOUS: L5 pars defects bilaterally, with mild slip.  IMPRESSION: 1. 4.5 x 2 x 3 cm left perianal abscess. No supralevator involvement. 2. There may be perirectal fistula, which would be better assessed  by MRI if clinically needed. 3. 2.5 cm pilonidal cyst/ abscess at the upper gluteal cleft.   Electronically Signed   By: Tiburcio Pea M.D.   On: 09/30/2013 05:29    Microbiology: Recent Results (from the past 240 hour(s))  SURGICAL PCR SCREEN     Status: None   Collection Time    09/30/13  9:48 AM      Result Value Ref Range Status   MRSA, PCR NEGATIVE  NEGATIVE Final   Staphylococcus aureus NEGATIVE  NEGATIVE Final   Comment:            The Xpert SA Assay (FDA     approved for NASAL specimens     in patients over 73 years of age),     is one component of     a comprehensive surveillance     program.  Test performance has     been validated by [employer]  [facility] for patients greater     than or equal to 26 year old.     It is not intended     to diagnose infection nor to     guide or monitor treatment.  CULTURE, ROUTINE-ABSCESS     Status: None   Collection Time    09/30/13 12:05 PM      Result Value Ref Range Status   Specimen Description ABSCESS PERIRECTAL   Final   Special Requests PATIENT ON FOLLOWING ZOSYN   Final   Gram Stain     Final   Value: FEW WBC PRESENT,BOTH PMN AND MONONUCLEAR     NO SQUAMOUS EPITHELIAL CELLS SEEN     RARE GRAM POSITIVE COCCI     IN PAIRS     Performed at [facility]   Culture     Final   Value: NO GROWTH 1 DAY     Performed at [facility]   Report Status PENDING   Incomplete  ANAEROBIC CULTURE     Status: None   Collection Time    09/30/13 12:05 PM      Result Value Ref Range Status   Specimen Description ABSCESS PERIRECTAL   Final   Special Requests PATIENT ON FOLLOWING ZOSYN   Final   Gram Stain     Final   Value: MODERATE WBC PRESENT,BOTH PMN AND MONONUCLEAR     NO SQUAMOUS EPITHELIAL CELLS SEEN     FEW GRAM POSITIVE COCCI     IN PAIRS     Performed at [facility]   Culture     Final   Value: NO ANAEROBES ISOLATED; CULTURE IN PROGRESS FOR 5 DAYS     Performed at [facility]   Report Status  PENDING   Incomplete     Labs: Basic Metabolic Panel:  Recent Labs Lab 09/30/13 0315  NA 140  K 4.1  CL 102  CO2 22  GLUCOSE 99  BUN 12  CREATININE 1.30  CALCIUM 9.9   Liver Function Tests: No results found for this basename: AST, ALT, ALKPHOS, BILITOT, PROT, ALBUMIN,  in the last 168 hours No results found for this basename: LIPASE, AMYLASE,  in the last 168 hours No results found for this basename: AMMONIA,  in the last 168 hours CBC:  Recent Labs Lab 09/30/13 0315  WBC 20.2*  NEUTROABS 15.2*  HGB 14.2  HCT 43.1  MCV 73.3*  PLT 344   Principal Problem:   Perirectal abscess Active Problems:   Pilonidal cyst   Marijuana use   Signed:  Krizia Flight, ANP-BC

## 2013-10-02 NOTE — Discharge Summary (Signed)
General Surgery [facility]  Agree with plans for discharge today.  Would like patient to see Dr. Romie LeveeAlicia Thomas to evaluate for possible fistula.  Velora Hecklerodd M. Jahkeem Kurka, MD, [facility] Office: [phone]

## 2013-10-02 NOTE — Discharge Instructions (Signed)

## 2013-10-02 NOTE — Progress Notes (Signed)
General Surgery [facility]  Agree with plans for discharge today.  Follow up with Dr. 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  Velora Hecklerodd M. Hayes Czaja, MD, [facility] Office: [phone]

## 2013-10-07 LAB — ANAEROBIC CULTURE

## 2013-10-08 ENCOUNTER — Ambulatory Visit: Payer: Self-pay | Attending: Internal Medicine | Admitting: Family Medicine

## 2013-10-08 ENCOUNTER — Ambulatory Visit: Payer: Self-pay

## 2013-10-08 VITALS — BP 143/89 | HR 62 | Temp 98.4°F | Resp 18 | Ht 76.0 in | Wt 298.0 lb

## 2013-10-08 DIAGNOSIS — K611 Rectal abscess: Secondary | ICD-10-CM

## 2013-10-08 DIAGNOSIS — K612 Anorectal abscess: Secondary | ICD-10-CM | POA: Insufficient documentation

## 2013-10-08 NOTE — Progress Notes (Signed)
   Subjective:    Patient ID: Richard Huber, male    DOB: 04-14-1986, 28 y.o.   MRN: [id number]  HPI Patient is here today to establish care.  He was recently discharged from the hospital after his third perirectal abscess and also having history of panel cysts. He is currently being worked up by surgery with concern of a fistula. Today he says he feels well, his pain is well controlled, he does not need any medications. He did not realize he was supposed to call and schedule the appointment to follow up with surgery but he has the phone number and says he will do so this afternoon. He has no other health problems nor significant family history.   Review of Systems A 12 point review of systems is negative except as per hpi.       Objective:   Physical Exam  Nursing note and vitals reviewed. Constitutional: He is oriented to person, place, and time. He  appears well-developed and well-nourished.  HENT:  Neck: Normal range of motion. Neck supple. No thyromegaly present.  Cardiovascular: Normal rate, regular rhythm and normal heart sounds.   Pulmonary/Chest: Effort normal and breath sounds normal.  Abdominal: Soft. Bowel sounds are normal.  no distension. There is no tenderness. There is no rebound.  Lymphadenopathy:    He has no cervical adenopathy.  Neurological: He is alert and oriented to person, place, and time. He has normal reflexes.  Skin: Skin is warm and dry.He has no concerning moles or skin lesions Psychiatric: He has a normal mood and affect. His behavior is normal.        Assessment & Plan:  Perirectal abscess  F/u with surgery as directed. F/u here for cpe in 1-3 mos or earlier as needed. Call with questions/concerns.

## 2013-10-08 NOTE — Progress Notes (Signed)
Pt came in today to establish care. Pt s/p  I&D of perianal abscess  09/30/13. Pt reports being discharged on antibiotic which he states, he has completed.

## 2013-11-05 ENCOUNTER — Encounter (INDEPENDENT_AMBULATORY_CARE_PROVIDER_SITE_OTHER): Payer: Self-pay | Admitting: General Surgery

## 2013-11-05 ENCOUNTER — Ambulatory Visit (INDEPENDENT_AMBULATORY_CARE_PROVIDER_SITE_OTHER): Payer: Self-pay | Admitting: General Surgery

## 2013-11-05 VITALS — BP 140/80 | HR 74 | Temp 97.6°F | Resp 16 | Ht 76.0 in | Wt 301.2 lb

## 2013-11-05 DIAGNOSIS — L988 Other specified disorders of the skin and subcutaneous tissue: Secondary | ICD-10-CM

## 2013-11-05 DIAGNOSIS — Z9889 Other specified postprocedural states: Secondary | ICD-10-CM

## 2013-11-05 NOTE — Patient Instructions (Signed)
Call the office if you like to schedule surgery to remove your pilonidal cyst.

## 2013-11-05 NOTE — Progress Notes (Deleted)
Richard Huber is a 28 y.o. male who is here for a follow up visit regarding ***  Objective: Filed Vitals:   11/05/13 1043  BP: 140/80  Pulse: 74  Temp: 97.6 F (36.4 C)  Resp: 16    {[id number]}{[id number]}   Assessment and Plan: ***    Vanita PandaAlicia C Lanasia Porras, MD [facility], [place] [phone]

## 2013-11-05 NOTE — Progress Notes (Signed)
Richard HoylesRamsey Huber is a 28 y.o. male who is status post an I&D of a perirectal abscess.  He reports that he is feeling better from that.  He is having regular BM's.  His pain is controlled.  He has had one abscess at that same spot 3 years ago.  He also describes a mass in his pilonidal region that swells and drains on a monthly basis.    Objective: Filed Vitals:   11/05/13 1043  BP: 140/80  Pulse: 74  Temp: 97.6 F (36.4 C)  Resp: 16    General appearance: alert and cooperative GI: normal findings: soft, non-tender  Incision: healing well, no drainage, Left anterior region  Non-inflamed pilonidal cyst with 3 pilonidal pits noticed   Assessment: s/p  Patient Active Problem List   Diagnosis Date Noted  . Perirectal abscess 09/30/2013  . Pilonidal cyst 09/30/2013  . Marijuana use 09/30/2013    Plan: Richard Hoylesamsey Fera Is recovering from an I&D of a perianal abscess.  He also has a non-inflamed pilonidal cyst more superiorly. We discussed the possible recurrence of his perirectal abscess. Given that he has had 2 episodes relatively far apart, I have not recommended fistulas surgery at this time. We will follow this and if he continues to have problems or developed a fistula after the surgery, we will readdress this. I offered to excise his pilonidal cyst as this seems to be causing him more problems at the moment. He does want to have this procedure performed but possibly a couple months. We discussed that this would entail removing the infection and debris and leaving the wound open to heal by secondary intention. We discussed that this can take months to heal appropriately. He will call the office when he is ready to schedule surgery. The patient was informed on the signs and symptoms of a anorectal fistula. He will call the office if he develops these as well.    Vanita Panda.Keilee Denman C Everley Evora, MD [facility], [place] [phone]   11/05/2013 11:05 AM

## 2013-12-01 ENCOUNTER — Encounter (INDEPENDENT_AMBULATORY_CARE_PROVIDER_SITE_OTHER): Payer: Self-pay

## 2013-12-01 ENCOUNTER — Ambulatory Visit (INDEPENDENT_AMBULATORY_CARE_PROVIDER_SITE_OTHER): Payer: Self-pay | Admitting: General Surgery

## 2013-12-01 ENCOUNTER — Encounter (HOSPITAL_BASED_OUTPATIENT_CLINIC_OR_DEPARTMENT_OTHER): Payer: Self-pay | Admitting: *Deleted

## 2013-12-01 VITALS — BP 134/78 | HR 78 | Temp 98.0°F | Resp 18 | Ht 76.0 in | Wt 294.0 lb

## 2013-12-01 DIAGNOSIS — K612 Anorectal abscess: Secondary | ICD-10-CM

## 2013-12-01 DIAGNOSIS — K611 Rectal abscess: Secondary | ICD-10-CM

## 2013-12-01 MED ORDER — DOCUSATE SODIUM 100 MG PO CAPS: 100.0000 mg | ORAL_CAPSULE | Freq: Two times a day (BID) | ORAL | Status: AC

## 2013-12-01 MED ORDER — OXYCODONE HCL 5 MG PO TABS: 5.0000 mg | ORAL_TABLET | ORAL | Status: DC | PRN

## 2013-12-01 MED ORDER — AMOXICILLIN-POT CLAVULANATE 875-125 MG PO TABS
1.0000 | ORAL_TABLET | Freq: Two times a day (BID) | ORAL | Status: AC
Start: 2013-12-01 — End: 2013-12-08

## 2013-12-01 NOTE — Progress Notes (Signed)
NPO AFTER MN WITH EXCEPTION CLEAR LIQUIDS UNTIL 0830 (NO CREAM/ MILK PRODUCTS).  ARRIVE AT 1300.  NEEDS HG. MAY TAKE OXYCODONE IF NEEDED W/ SIPS OF WATER AM DOS.

## 2013-12-01 NOTE — Progress Notes (Signed)
Richard Huber is a 28 y.o. male who is status post an I&D of a perirectal abscess in early March. He has had one abscess at that same spot 3 years ago. He also describes a mass in his pilonidal region that swells and drains on a monthly basis.  Objective:  There were no vitals filed for this visit.  General appearance: alert and cooperative  GI: normal findings: soft, non-tender  Incision: healing well, no drainage, Left anterior region  Non-inflamed pilonidal cyst with 3 pilonidal pits noticed  Assessment:  s/p  Patient Active Problem List    Diagnosis  Date Noted   .  Perirectal abscess  09/30/2013   .  Pilonidal cyst  09/30/2013   .  Marijuana use  09/30/2013    Plan:  Richard Hoylesamsey Huber Is recovering from an I&D of a perianal abscess. He also has a non-inflamed pilonidal cyst more superiorly. We discussed the possible recurrence of his perirectal abscess. Given that he has had 2 episodes relatively far apart, I have not recommended fistulas surgery at this time. We will follow this and if he continues to have problems or developed a fistula after the surgery, we will readdress this. I offered to excise his pilonidal cyst as this seems to be causing him more problems at the moment. He does want to have this procedure performed but possibly a couple months. We discussed that this would entail removing the infection and debris and leaving the wound open to heal by secondary intention. We discussed that this can take months to heal appropriately. He will call the office when he is ready to schedule surgery. The patient was informed on the signs and symptoms of a anorectal fistula. He will call the office if he develops these as well.   Vanita Panda.Ermelinda Eckert C Ainslie Mazurek, MD  [facility], [place]  [phone]

## 2013-12-01 NOTE — Patient Instructions (Addendum)
With have scheduled you for surgery tomorrow afternoon. Nothing to eat after midnight tonight.  Ok to take pills with a sip of water.     Anal Abscess/Fistula  What is an anal abscess? An anal abscess is an infected cavity filled with pus found near the anus or rectum. What is an anal fistula? An anal fistula (also called fistula-in-ano) is frequently the result of a previous or current anal abscess, occurring in up to 50% of patients with abscesses. Normal anatomy includes small glands just inside the anus. Occasionally, these glands get clogged and potentially can become infected, leading to an abscess. The fistula is a tunnel that forms under the skin and connects the infected glands to the abscess. A fistula can be present with or without an abscess and may connect just to the skin of the buttocks near the anal opening. Other situations that can result in a fistula include Crohn's disease, radiation, trauma and malignancy. How does someone get an anal abscess or a fistula? The abscess is most often a result of an acute infection in the internal glands of the anus. Occasionally, bacteria, fecal material or foreign matter can clog the anal gland and create a condition for an abscess cavity to form. Other medical conditions can make these types of infections more likely.  After an abscess drains on its own or has been drained (opened), a tunnel (fistula) may persist, connecting the infected anal gland to the external skin. This typically will involve some type of drainage from the external opening and occurs in up to 50% of abscesses. If the opening on the skin heals when a fistula is present, a recurrent abscess may develop. What are the specific signs or symptoms of an abscess or fistula? A patient with an abscess may have pain, redness or swelling in the area around the anal area. Fatigue, general malaise, as well as accompanying fever or chills are also common. Similar signs and symptoms may be  present when patients have a fistula, with the addition of possible irritation of the perianal skin or drainage from an external opening. Is any specific testing necessary to diagnose an abscess or fistula? No. Most anal abscesses or fistula-in-ano are diagnosed and managed on the basis of clinical findings. Occasionally, additional studies such as ultrasound, CT scan, or MRI can assist with the diagnosis of deeper abscesses or the delineation of the fistula tunnel to help guide treatment.  What is the treatment of an anal abscess? The treatment of an abscess is surgical drainage under most circumstances. An incision is made in the skin near the anus to drain the infection. This can be done in a doctor's office with local anesthetic or in an operating room under deeper anesthesia. Hospitalization may be required for patients prone to more significant infections such as diabetics or patients with decreased immunity. Are antibiotics required to treat this type of infection? Antibiotics alone are a poor alternative to drainage of the infection. For uncomplicated abscesses, the addition of antibiotics to surgical drainage does not improve healing time or reduce the potential for recurrences. There are some conditions in which antibiotics are indicated, such as for patients with compromised or altered immunity, some cardiac valvular conditions or extensive cellulitis. A comprehensive discussion of your past medical history and a physical exam are important to determine if antibiotics are indicated. What is the treatment of an anal fistula? Surgery is almost always necessary to cure an anal fistula. Although surgery can be fairly straightforward, it may  also be complicated, occasionally requiring staged or multiple operations. Consider identifying a specialist in colon and rectal surgery who would be familiar with a number of potential operations to treat the fistula. The surgery may be performed at the same time  as drainage of an abscess, although sometimes the fistula doesn't appear until weeks to years after the initial drainage. If the fistula is straightforward, a fistulotomy may be performed. This procedure involves connecting the internal opening within the anal canal to the external opening, creating a groove that will heal from the inside out. This surgery often will require dividing a small portion of the sphincter muscle which has the unlikely potential for affecting the control of bowel movements in a limited number of cases.  Other procedures include placing material within the fistula tract to occlude it or surgically altering the surrounding tissue to accomplish closure of the fistula, with the choice of procedure depending upon the type, length, and location of the fistula. Most of the operations can be performed on an outpatient basis, but may occasionally require hospitalization. What is the recovery like from surgery? Pain after surgery is controlled with pain pills, fiber and bulk laxatives. Patients should plan for time at home using sitz baths and attempt to avoid the constipation that can be associated with prescription pain medication. Discuss with your surgeon the specific care and time away from work prior to surgery to prepare yourself for post-operative care. Can the abscess or fistula recur? If adequately treated and properly healed, both are unlikely to return. However, despite proper and indicated open or minimally invasive treatment, both abscesses and fistulas can potentially recur. Should similar symptoms arise, suggesting recurrence, it is recommended that you find a colon and rectal surgeon to manage your condition. What is a colon and rectal surgeon? Colon and rectal surgeons are experts in the surgical and non-surgical treatment of diseases of the colon, rectum and anus. They have completed advanced surgical training in the treatment of these diseases as well as full general  surgical training. Board-certified colon and rectal surgeons complete residencies in general surgery and colon and rectal surgery, and pass intensive examinations conducted by the American Board of Surgery and the American Board of Colon and Rectal Surgery. They are well-versed in the treatment of both benign and malignant diseases of the colon, rectum and anus and are able to perform routine screening examinations and surgically treat conditions if indicated to do so.  author: Mikle BosworthMichael Buckmire, MD, FACS, FASCRS, on behalf of the ASCRS Public Relations Committee  [id number] American Society of Colon & Rectal Surgeons

## 2013-12-02 ENCOUNTER — Ambulatory Visit (HOSPITAL_BASED_OUTPATIENT_CLINIC_OR_DEPARTMENT_OTHER): Admission: RE | Admit: 2013-12-02 | Payer: Self-pay | Source: Ambulatory Visit | Admitting: General Surgery

## 2013-12-02 ENCOUNTER — Encounter (INDEPENDENT_AMBULATORY_CARE_PROVIDER_SITE_OTHER): Payer: Self-pay | Admitting: General Surgery

## 2013-12-02 ENCOUNTER — Telehealth (INDEPENDENT_AMBULATORY_CARE_PROVIDER_SITE_OTHER): Payer: Self-pay | Admitting: General Surgery

## 2013-12-02 ENCOUNTER — Other Ambulatory Visit (INDEPENDENT_AMBULATORY_CARE_PROVIDER_SITE_OTHER): Payer: Self-pay | Admitting: General Surgery

## 2013-12-02 SURGERY — INCISION AND DRAINAGE, ABSCESS
Anesthesia: General | Site: Anus

## 2013-12-02 NOTE — Telephone Encounter (Signed)
Patient called said did not want surgery 12/02/13 as cyst popped last night, I paged Dr. Maisie Fushomas who instructed me to tell the patient she still needed him to come to the OR.  Patient said he did not feel it was necessary and could not afford it.  I told him we could not make him come but Dr. Maisie Fushomas really wanted him there as it would come back. Paged Dr. Maisie Fushomas again and advised her of his responds.

## 2013-12-02 NOTE — Telephone Encounter (Signed)
Patient failed to arrive for surgery against medical advice.

## 2014-11-23 ENCOUNTER — Emergency Department (HOSPITAL_COMMUNITY)
Admission: EM | Admit: 2014-11-23 | Discharge: 2014-11-23 | Disposition: A | Discharge status: Home / Self Care | Payer: Self-pay | Attending: Emergency Medicine | Admitting: Emergency Medicine

## 2014-11-23 ENCOUNTER — Emergency Department (HOSPITAL_COMMUNITY): Payer: Self-pay

## 2014-11-23 ENCOUNTER — Encounter (HOSPITAL_COMMUNITY): Payer: Self-pay | Admitting: Physical Medicine and Rehabilitation

## 2014-11-23 DIAGNOSIS — K047 Periapical abscess without sinus: Secondary | ICD-10-CM | POA: Insufficient documentation

## 2014-11-23 DIAGNOSIS — R55 Syncope and collapse: Secondary | ICD-10-CM | POA: Insufficient documentation

## 2014-11-23 DIAGNOSIS — Z872 Personal history of diseases of the skin and subcutaneous tissue: Secondary | ICD-10-CM | POA: Insufficient documentation

## 2014-11-23 DIAGNOSIS — Z87891 Personal history of nicotine dependence: Secondary | ICD-10-CM | POA: Insufficient documentation

## 2014-11-23 DIAGNOSIS — Z79899 Other long term (current) drug therapy: Secondary | ICD-10-CM | POA: Insufficient documentation

## 2014-11-23 DIAGNOSIS — K008 Other disorders of tooth development: Secondary | ICD-10-CM | POA: Insufficient documentation

## 2014-11-23 LAB — COMPREHENSIVE METABOLIC PANEL
ALK PHOS: 104 U/L (ref 39–117)
ALT: 16 U/L (ref 0–53)
AST: 18 U/L (ref 0–37)
Albumin: 4.5 g/dL (ref 3.5–5.2)
Anion gap: 10 (ref 5–15)
BILIRUBIN TOTAL: 0.8 mg/dL (ref 0.3–1.2)
BUN: 11 mg/dL (ref 6–23)
CALCIUM: 9.7 mg/dL (ref 8.4–10.5)
CO2: 25 mmol/L (ref 19–32)
Chloride: 106 mmol/L (ref 96–112)
Creatinine, Ser: 1.16 mg/dL (ref 0.50–1.35)
GFR calc Af Amer: >90 mL/min (ref >90)
GFR calc non Af Amer: 86 mL/min — ABNORMAL LOW (ref >90)
Glucose, Bld: 91 mg/dL (ref 70–99)
Potassium: 4.3 mmol/L (ref 3.5–5.1)
SODIUM: 141 mmol/L (ref 135–145)
Total Protein: 8.2 g/dL (ref 6.0–8.3)

## 2014-11-23 LAB — CBC WITH DIFFERENTIAL/PLATELET
BASOS PCT: 0 % (ref 0–1)
Basophils Absolute: 0 10*3/uL (ref 0.0–0.1)
EOS ABS: 0.3 10*3/uL (ref 0.0–0.7)
EOS PCT: 3 % (ref 0–5)
HEMATOCRIT: 43.9 % (ref 39.0–52.0)
Hemoglobin: 14.4 g/dL (ref 13.0–17.0)
LYMPHS ABS: 2.2 10*3/uL (ref 0.7–4.0)
Lymphocytes Relative: 25 % (ref 12–46)
MCH: 24 pg — ABNORMAL LOW (ref 26.0–34.0)
MCHC: 32.8 g/dL (ref 30.0–36.0)
MCV: 73 fL — ABNORMAL LOW (ref 78.0–100.0)
MONO ABS: 0.9 10*3/uL (ref 0.1–1.0)
Monocytes Relative: 10 % (ref 3–12)
Neutro Abs: 5.2 10*3/uL (ref 1.7–7.7)
Neutrophils Relative %: 62 % (ref 43–77)
Platelets: 283 10*3/uL (ref 150–400)
RBC: 6.01 MIL/uL — ABNORMAL HIGH (ref 4.22–5.81)
RDW: 12.6 % (ref 11.5–15.5)
WBC: 8.6 10*3/uL (ref 4.0–10.5)

## 2014-11-23 LAB — I-STAT TROPONIN, ED: Troponin i, poc: 0 ng/mL (ref 0.00–0.08)

## 2014-11-23 MED ORDER — AMOXICILLIN-POT CLAVULANATE 875-125 MG PO TABS
1.0000 | ORAL_TABLET | Freq: Once | ORAL | Status: AC
  Administered 2014-11-23: 1 via ORAL
  Filled 2014-11-23: qty 1

## 2014-11-23 MED ORDER — SODIUM CHLORIDE 0.9 % IV BOLUS (SEPSIS)
1000.0000 mL | Freq: Once | INTRAVENOUS | Status: AC
  Administered 2014-11-23: 1000 mL via INTRAVENOUS

## 2014-11-23 MED ORDER — MORPHINE SULFATE 4 MG/ML IJ SOLN
4.0000 mg | Freq: Once | INTRAMUSCULAR | Status: AC
  Administered 2014-11-23: 4 mg via INTRAVENOUS
  Filled 2014-11-23: qty 1

## 2014-11-23 MED ORDER — HYDROMORPHONE HCL 1 MG/ML IJ SOLN
1.0000 mg | Freq: Once | INTRAMUSCULAR | Status: AC
  Administered 2014-11-23: 1 mg via INTRAVENOUS
  Filled 2014-11-23: qty 1

## 2014-11-23 MED ORDER — IOHEXOL 300 MG/ML  SOLN
75.0000 mL | Freq: Once | INTRAMUSCULAR | Status: AC | PRN
Start: 2014-11-23 — End: 2014-11-23
  Administered 2014-11-23: 75 mL via INTRAVENOUS

## 2014-11-23 MED ORDER — AMOXICILLIN-POT CLAVULANATE 875-125 MG PO TABS: 1.0000 | ORAL_TABLET | Freq: Two times a day (BID) | ORAL | Status: AC

## 2014-11-23 MED ORDER — OXYCODONE-ACETAMINOPHEN 5-325 MG PO TABS: 1.0000 | ORAL_TABLET | ORAL | Status: DC | PRN

## 2014-11-23 NOTE — ED Provider Notes (Signed)
CSN: [id number]     Arrival date & time 11/23/14  1355 History   First MD Initiated Contact with Patient 11/23/14 [phone]     Chief Complaint  Patient presents with  . Loss of Consciousness  . Dental Pain     (Consider location/radiation/quality/duration/timing/severity/associated sxs/prior Treatment) The history is provided by the patient.  Richard Huber is a 29 y.o. male here with dental pain, facial swelling, syncope. Patient has been having R lower dental pain for several weeks, getting worse. Has worsening R jaw swelling over several days. No fevers. Today, was standing up and had worse pain so became sweaty and light headed and passed out. Denies chest pain prior to passing out. Denies other injuries.    Past Medical History  Diagnosis Date  . Perirectal abscess     RECURRENT  . Pilonidal cyst     CHRONIC   Past Surgical History  Procedure Laterality Date  . Incision and drainage perirectal abscess N/A 09/30/2013    Procedure: IRRIGATION AND DEBRIDEMENT PERIRECTAL ABSCESS;  Surgeon: Velora Heckler, MD;  Location: [facility];  Service: General;  Laterality: N/A;  . Eua/  i  &  d complex perirectal abscess  01-03-2011   History reviewed. No pertinent family history. History  Substance Use Topics  . Smoking status: Former Smoker -- 0.25 packs/day for 1 years    Types: Cigarettes    Quit date: 06/03/2013  . Smokeless tobacco: Never Used  . Alcohol Use: Yes     Comment: SOCIAL    Review of Systems  HENT: Positive for facial swelling.   Cardiovascular: Positive for syncope.  Neurological: Positive for syncope.  All other systems reviewed and are negative.     Allergies  Review of patient's allergies indicates no known allergies.  Home Medications   Prior to Admission medications   Medication Sig Start Date End Date Taking? Authorizing Provider  acetaminophen (TYLENOL) 500 MG tablet Take 1,000 mg by mouth every 6 (six) hours as needed for moderate pain.   Yes  Historical Provider, MD  ibuprofen (ADVIL,MOTRIN) 200 MG tablet Take 600 mg by mouth every 6 (six) hours as needed for moderate pain.   Yes Historical Provider, MD  docusate sodium (COLACE) 100 MG capsule Take 1 capsule (100 mg total) by mouth 2 (two) times daily. 12/01/13   Romie Levee, MD   BP 146/94 mmHg  Pulse 48  Temp(Src) 98 F (36.7 C) (Oral)  Resp 18  SpO2 96% Physical Exam  Constitutional: He is oriented to person, place, and time. He appears well-developed and well-nourished.  HENT:  Head: Normocephalic.  Poor dentition overall. Multiple fillings. No obvious peri apical abscess. R submandibular swelling, tenderness. Floor of mouth soft.   Eyes: Conjunctivae are normal. Pupils are equal, round, and reactive to light.  Neck: Normal range of motion. Neck supple.  Cardiovascular: Normal rate, regular rhythm and normal heart sounds.   Pulmonary/Chest: Effort normal and breath sounds normal. No respiratory distress. He has no wheezes. He has no rales.  Abdominal: Soft. Bowel sounds are normal. He exhibits no distension. There is no tenderness. There is no rebound and no guarding.  Musculoskeletal: Normal range of motion. He exhibits no edema or tenderness.  Neurological: He is alert and oriented to person, place, and time. No cranial nerve deficit. Coordination normal.  Skin: Skin is warm and dry.  Psychiatric: He has a normal mood and affect. His behavior is normal. Judgment and thought content normal.  Nursing note and vitals reviewed.  ED Course  Procedures (including critical care time) Labs Review Labs Reviewed  CBC WITH DIFFERENTIAL/PLATELET - Abnormal; Notable for the following:    RBC 6.01 (*)    MCV 73.0 (*)    MCH 24.0 (*)    All other components within normal limits  COMPREHENSIVE METABOLIC PANEL - Abnormal; Notable for the following:    GFR calc non Af Amer 86 (*)    All other components within normal limits  I-STAT TROPOININ, ED    Imaging Review Dg Chest  2 View  11/23/2014   CLINICAL DATA:  Loss of consciousness, dizziness, dental pain  EXAM: CHEST  2 VIEW  COMPARISON:  None.  FINDINGS: The heart size and mediastinal contours are within normal limits. Both lungs are clear. The visualized skeletal structures are unremarkable.  IMPRESSION: No active cardiopulmonary disease.   Electronically Signed   By: Judie PetitM.  Shick M.D.   On: 11/23/2014 17:35   Ct Soft Tissue Neck W Contrast  11/23/2014   CLINICAL DATA:  Right lower dental pain and swelling  EXAM: CT NECK WITH CONTRAST  TECHNIQUE: Multidetector CT imaging of the neck was performed using the standard protocol following the bolus administration of intravenous contrast.  CONTRAST:  75mL OMNIPAQUE IOHEXOL 300 MG/ML  SOLN  COMPARISON:  None  FINDINGS: Pharynx and larynx: Normal.  No fluid collection or mass identified.  Salivary glands: The parotid and submandibular glands appear within normal limits.  Thyroid: Normal and symmetric appearance of the thyroid gland.  Lymph nodes: Multiple prominent cervical lymph nodes are identified. The right submandibular node measures 8 mm, image 56/series 2. Right level 2 node measures 1.2 cm, image 54/series 2.  Vascular: Patent.  Limited intracranial: The visualized intracranial contents are within normal limits.  Visualized orbits: Normal appearance of the orbits.  Mastoids and visualized paranasal sinuses: There is mucosal thickening involving the inferior aspect of the right maxillary sinus, image 26/series 2. The remaining paranasal sinuses are clear. The mastoid air cells are clear.  Skeleton: Left upper first molar periapical lucencies are identified. There is a large dental cavity involving this tooth. Right lower first molar cavity and periapical lucency noted. There is overlying edema within the subcutaneous soft tissues and diffuse fat stranding as well as skin thickening compatible with cellulitis. No focal fluid collections identified.  Upper chest: Lung apices are  clear.  IMPRESSION: 1. Right facial cellulitis. 2. Right lower first molar dental cavity and periapical abscess. There is associated mucosal thickening involving the right maxillary sinus. 3. Left upper first molar dental cavity and periapical abscess 4. Numerous prominent cervical lymph nodes are likely reactive.   Electronically Signed   By: Signa Kellaylor  Stroud M.D.   On: 11/23/2014 17:55     EKG Interpretation   Date/Time:  Tuesday November 23 2014 14:01:41 EDT Ventricular Rate:  72 PR Interval:  160 QRS Duration: 86 QT Interval:  386 QTC Calculation: 422 R Axis:   66 Text Interpretation:  Sinus rhythm with marked sinus arrhythmia Otherwise  normal ECG No significant change since last tracing Confirmed by YAO  MD,  DAVID ([id number]) on 11/23/2014 3:17:30 PM      MDM   Final diagnoses:  None    Richard Huber is a 29 y.o. male here with R jaw swelling, syncope. Syncope likely from pain or swelling. No family hx of early death or CAD. Low risk from [place] criteria. Will do CT neck, labs, orthostatics. Will hydrate and reassess.   7:11 PM Felt better. WBC  nl. CT showed multiple periapical abscesses. However, I had trouble palpating them and do not feel comfortable performing I and D. Tried to consult Dr. Russella Dar but didn't hear back. Will give augmentin, percocet. Told him to call Dr. Stefani Dama office tomorrow AM.    Richardean Canal, MD 11/23/14 [phone]

## 2014-11-23 NOTE — ED Notes (Signed)
Pt presents to department via [facility] for evaluation of syncope and dental pain. Pt reports R lower dental pain and facial swelling. Pt went to school, became sweaty and lightheaded. Witnessed syncopal episode, upon arrival pt is alert and oriented x4. 18g RAC. CBG 190.

## 2014-11-23 NOTE — Discharge Instructions (Signed)
Take motrin for pain.   Take augmentin twice daily for a week.   Take percocet for pain. DO NOT drive with it.   Call Dr. Russella DarBenitez tomorrow to get urgent appointment.   Return to ER if you have worse swelling, fever, trouble swallowing, passing out.

## 2014-11-23 NOTE — Progress Notes (Signed)
Patient needs assistance with PCP and follow up care/access to care. LCSW attempted to see patient, however RN working with patient and patient is also going to xray.  LCSW gave referral information to CM who will see patient once all medical needs are completed.  Mother in room along with several other people.  Patient is afraid and upset about coming to the hospital due to payment and hospital bill.  CM to follow up.  Deretha EmoryHannah Telma Pyeatt LCSW, MSW Clinical Social Work: Emergency Room [phone]

## 2015-07-08 ENCOUNTER — Emergency Department (HOSPITAL_COMMUNITY)
Admission: EM | Admit: 2015-07-08 | Discharge: 2015-07-08 | Disposition: A | Discharge status: Home / Self Care | Payer: Self-pay | Attending: Emergency Medicine | Admitting: Emergency Medicine

## 2015-07-08 ENCOUNTER — Encounter (HOSPITAL_COMMUNITY): Payer: Self-pay | Admitting: Emergency Medicine

## 2015-07-08 DIAGNOSIS — Z87891 Personal history of nicotine dependence: Secondary | ICD-10-CM | POA: Insufficient documentation

## 2015-07-08 DIAGNOSIS — S00511A Abrasion of lip, initial encounter: Secondary | ICD-10-CM | POA: Insufficient documentation

## 2015-07-08 DIAGNOSIS — Y9389 Activity, other specified: Secondary | ICD-10-CM | POA: Insufficient documentation

## 2015-07-08 DIAGNOSIS — S60511A Abrasion of right hand, initial encounter: Secondary | ICD-10-CM | POA: Insufficient documentation

## 2015-07-08 DIAGNOSIS — Z79899 Other long term (current) drug therapy: Secondary | ICD-10-CM | POA: Insufficient documentation

## 2015-07-08 DIAGNOSIS — Y92410 Unspecified street and highway as the place of occurrence of the external cause: Secondary | ICD-10-CM | POA: Insufficient documentation

## 2015-07-08 DIAGNOSIS — Z872 Personal history of diseases of the skin and subcutaneous tissue: Secondary | ICD-10-CM | POA: Insufficient documentation

## 2015-07-08 DIAGNOSIS — Y998 Other external cause status: Secondary | ICD-10-CM | POA: Insufficient documentation
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
## 2017-04-19 ENCOUNTER — Emergency Department (HOSPITAL_COMMUNITY): Payer: Self-pay

## 2017-04-19 ENCOUNTER — Emergency Department (HOSPITAL_COMMUNITY)
Admission: EM | Admit: 2017-04-19 | Discharge: 2017-04-20 | Disposition: A | Discharge status: Home / Self Care | Payer: Self-pay | Attending: Emergency Medicine | Admitting: Emergency Medicine

## 2017-04-19 ENCOUNTER — Encounter (HOSPITAL_COMMUNITY): Payer: Self-pay

## 2017-04-19 DIAGNOSIS — L0390 Cellulitis, unspecified: Secondary | ICD-10-CM | POA: Insufficient documentation

## 2017-04-19 DIAGNOSIS — Z87891 Personal history of nicotine dependence: Secondary | ICD-10-CM | POA: Insufficient documentation

## 2017-04-19 DIAGNOSIS — L02211 Cutaneous abscess of abdominal wall: Secondary | ICD-10-CM | POA: Insufficient documentation

## 2017-04-19 DIAGNOSIS — L0291 Cutaneous abscess, unspecified: Secondary | ICD-10-CM

## 2017-04-19 HISTORY — DX: Hidradenitis suppurativa: L73.2

## 2017-04-19 LAB — CBC WITH DIFFERENTIAL/PLATELET
Basophils Absolute: 0 10*3/uL (ref 0.0–0.1)
Basophils Relative: 0 %
Eosinophils Absolute: 0.6 10*3/uL (ref 0.0–0.7)
Eosinophils Relative: 4 %
HCT: 37.7 % — ABNORMAL LOW (ref 39.0–52.0)
Hemoglobin: 12.5 g/dL — ABNORMAL LOW (ref 13.0–17.0)
Lymphocytes Relative: 15 %
Lymphs Abs: 2.2 10*3/uL (ref 0.7–4.0)
MCH: 24.6 pg — ABNORMAL LOW (ref 26.0–34.0)
MCHC: 33.2 g/dL (ref 30.0–36.0)
MCV: 74.1 fL — ABNORMAL LOW (ref 78.0–100.0)
Monocytes Absolute: 2.1 10*3/uL — ABNORMAL HIGH (ref 0.1–1.0)
Monocytes Relative: 14 %
Neutro Abs: 10 10*3/uL — ABNORMAL HIGH (ref 1.7–7.7)
Neutrophils Relative %: 67 %
Platelets: 306 10*3/uL (ref 150–400)
RBC: 5.09 MIL/uL (ref 4.22–5.81)
RDW: 12.8 % (ref 11.5–15.5)
WBC: 15 10*3/uL — ABNORMAL HIGH (ref 4.0–10.5)

## 2017-04-19 LAB — BASIC METABOLIC PANEL
Anion gap: 9 (ref 5–15)
BUN: 14 mg/dL (ref 6–20)
CO2: 24 mmol/L (ref 22–32)
Calcium: 9 mg/dL (ref 8.9–10.3)
Chloride: 106 mmol/L (ref 101–111)
Creatinine, Ser: 0.97 mg/dL (ref 0.61–1.24)
GFR calc Af Amer: >60 mL/min (ref >60)
GFR calc non Af Amer: >60 mL/min (ref >60)
Glucose, Bld: 76 mg/dL (ref 65–99)
Potassium: 4.5 mmol/L (ref 3.5–5.1)
Sodium: 139 mmol/L (ref 135–145)

## 2017-04-19 MED ORDER — MORPHINE SULFATE (PF) 4 MG/ML IV SOLN
4.0000 mg | Freq: Once | INTRAVENOUS | Status: AC
  Administered 2017-04-19: 4 mg via INTRAVENOUS
  Filled 2017-04-19: qty 1

## 2017-04-19 MED ORDER — SULFAMETHOXAZOLE-TRIMETHOPRIM 800-160 MG PO TABS
2.0000 | ORAL_TABLET | Freq: Once | ORAL | Status: AC
  Administered 2017-04-20: 2 via ORAL
  Filled 2017-04-19: qty 2

## 2017-04-19 MED ORDER — MORPHINE SULFATE (PF) 4 MG/ML IV SOLN
4.0000 mg | Freq: Once | INTRAVENOUS | Status: AC
  Administered 2017-04-20: 4 mg via INTRAVENOUS
  Filled 2017-04-19: qty 1

## 2017-04-19 MED ORDER — IOPAMIDOL (ISOVUE-300) INJECTION 61%
INTRAVENOUS | Status: AC
  Administered 2017-04-19: 100 mL via INTRAVENOUS
  Filled 2017-04-19: qty 100

## 2017-04-19 MED ORDER — IOPAMIDOL (ISOVUE-300) INJECTION 61%
100.0000 mL | Freq: Once | INTRAVENOUS | Status: AC | PRN
Start: 2017-04-19 — End: 2017-04-19
  Administered 2017-04-19: 100 mL via INTRAVENOUS

## 2017-04-19 MED ORDER — LIDOCAINE HCL (PF) 2 % IJ SOLN
20.0000 mL | Freq: Once | INTRAMUSCULAR | Status: AC
  Administered 2017-04-20: 10 mL
  Filled 2017-04-19: qty 20

## 2017-04-19 NOTE — ED Triage Notes (Signed)
Patient has redness to the lower abdomen x 4-5 days. Patient states he has history of abscesses.

## 2017-04-19 NOTE — ED Provider Notes (Signed)
[facility]-EMERGENCY DEPT Provider Note   CSN: [id number] Arrival date & time: 04/19/17  1538   History   Chief Complaint Chief Complaint  Patient presents with  . Abscess    HPI Richard Huber is a 31 y.o. male.  HPI    31 year old male presents today with complaints of redness and swelling to his lower abdomen.  Patient notes approximately 4-5 days ago he developed redness, fluctuance and pain to his abdomen.  He notes tenderness to the surrounding soft tissue.  He denies any nausea vomiting, fever chills.  Any other concerning signs or symptoms today.  Patient has a history of the same with several I&D's performed.  Patient did not take any medications prior to arrival.  Past Medical History:  Diagnosis Date  . Hidradenitis suppurativa   . Perirectal abscess    RECURRENT  . Pilonidal cyst    CHRONIC    Patient Active Problem List   Diagnosis Date Noted  . Perirectal abscess 09/30/2013  . Pilonidal cyst 09/30/2013  . Marijuana use 09/30/2013    Past Surgical History:  Procedure Laterality Date  . EUA/  I  &  D COMPLEX PERIRECTAL ABSCESS  01-03-2011  . INCISION AND DRAINAGE PERIRECTAL ABSCESS N/A 09/30/2013   Procedure: IRRIGATION AND DEBRIDEMENT PERIRECTAL ABSCESS;  Surgeon: Velora Heckler, MD;  Location: [facility];  Service: General;  Laterality: N/A;       Home Medications    Prior to Admission medications   Medication Sig Start Date End Date Taking? Authorizing Provider  aspirin-acetaminophen-caffeine (EXCEDRIN MIGRAINE) [phone] MG tablet Take 1 tablet by mouth every 6 (six) hours as needed for headache or migraine.   Yes [provider]  ibuprofen (ADVIL,MOTRIN) 200 MG tablet Take 600 mg by mouth every 6 (six) hours as needed for moderate pain.   Yes [provider]  acetaminophen (TYLENOL) 500 MG tablet Take 1,000 mg by mouth every 6 (six) hours as needed for moderate pain.    [provider]  amoxicillin-clavulanate (AUGMENTIN)  875-125 MG per tablet Take 1 tablet by mouth 2 (two) times daily. One po bid x 7 days Patient not taking: Reported on 04/19/2017 11/23/14   Charlynne Pander, MD  docusate sodium (COLACE) 100 MG capsule Take 1 capsule (100 mg total) by mouth 2 (two) times daily. Patient not taking: Reported on 04/19/2017 12/01/13   Romie Levee, MD  oxyCODONE-acetaminophen (PERCOCET/ROXICET) 5-325 MG tablet Take 1-2 tablets by mouth every 4 (four) hours as needed for severe pain. 04/20/17   Roberto Romanoski, Tinnie Gens, PA-C  sulfamethoxazole-trimethoprim (BACTRIM DS,SEPTRA DS) 800-160 MG tablet Take 2 tablets by mouth 2 (two) times daily. 04/20/17 04/30/17  Eyvonne Mechanic, PA-C    Family History History reviewed. No pertinent family history.  Social History Social History  Substance Use Topics  . Smoking status: Former Smoker    Packs/day: 0.25    Years: 1.00    Types: Cigarettes    Quit date: 06/03/2013  . Smokeless tobacco: Never Used  . Alcohol use Yes     Comment: SOCIAL     Allergies   Patient has no known allergies.   Review of Systems Review of Systems  All other systems reviewed and are negative.    Physical Exam Updated Vital Signs BP (!) 148/88 (BP Location: Right Arm)   Pulse 80   Temp 99 F (37.2 C) (Oral)   Resp 16   Ht  (1.93 m)   Wt 120.2 kg (265 lb)   SpO2 99%  BMI 32.26 kg/m   Physical Exam  Constitutional: He is oriented to person, place, and time. He appears well-developed and well-nourished.  HENT:  Head: Normocephalic and atraumatic.  Eyes: Pupils are equal, round, and reactive to light. Conjunctivae are normal. Right eye exhibits no discharge. Left eye exhibits no discharge. No scleral icterus.  Neck: Normal range of motion. No JVD present. No tracheal deviation present.  Pulmonary/Chest: Effort normal. No stridor.  Abdominal: Soft. There is tenderness.  2 cm area of fluctuance with surrounding redness to the lower abdomen  Neurological: He is alert and oriented to  person, place, and time. Coordination normal.  Psychiatric: He has a normal mood and affect. His behavior is normal. Judgment and thought content normal.  Nursing note and vitals reviewed.    ED Treatments / Results  Labs (all labs ordered are listed, but only abnormal results are displayed) Labs Reviewed  CBC WITH DIFFERENTIAL/PLATELET - Abnormal; Notable for the following:       Result Value   WBC 15.0 (*)    Hemoglobin 12.5 (*)    HCT 37.7 (*)    MCV 74.1 (*)    MCH 24.6 (*)    Neutro Abs 10.0 (*)    Monocytes Absolute 2.1 (*)    All other components within normal limits  BASIC METABOLIC PANEL    EKG  EKG Interpretation None       Radiology Ct Abdomen Pelvis W Contrast  Result Date: 04/19/2017 CLINICAL DATA:  Redness the lower abdomen for 4-5 days EXAM: CT ABDOMEN AND PELVIS WITH CONTRAST TECHNIQUE: Multidetector CT imaging of the abdomen and pelvis was performed using the standard protocol following bolus administration of intravenous contrast. CONTRAST:  <MEASUREMEBapVenetia NighPatAdvancMichiel Si<VeVenetia NighPatAdvancMichiel Si<MEASUREMENTPam SpecialtVenetia NighPatAdvancMichiel Si<MEASUREMENTDouglVenetia NighPatAdvancMichiel SiVenetia NighPatAdvancMichiel VenetVenetia NigVenetia NighPatAdvancMichiel Si<MEVenetia NighPatAdvancMichiel Si<MEASUREMENTAdventVenetia NighPatAdvancMichiel SVenetia NighPatAdvancMichiel Si<MEASUREMENT[facility] MediWashiEmory Spine PhVenetia NighPatAdvancMichiel Si<MEASUREMENTProvidence St. [place] NighPatAdvancMichiel Si<MEASUREMENTMer[facility] Plastic WashiBaylor ScVenetia NighPatAdvanVenetia NighPatAdvancMichiel Si<MEASUREMENTCarolina AmbulaVenetia NighPatAdvancMichiel Si<MEASUREMENT[facility] WashiOhio CountyVenetia NighPatAdvancMichiel Si<MEASUREMENTBufVenetia NighPatAdvancMichiel Si<MEASUREMENTVa [place] NighPatAdvVenetia NighPatAdvancMichiel [place] NighPatAdvancMichiel Sitesherren Kernsesay Incgional WashiLake City Community HospSpecialty Surgery Center LLCit No acute abnormality. Hepatobiliary: No focal liver abnormality is seen. No gallstones, gallbladder wall thickening, or biliary dilatation. Pancreas: Unremarkable. No pancreatic ductal dilatation or surrounding inflammatory changes. Spleen: Normal in size without focal abnormality. Adrenals/Urinary Tract: Adrenal glands are unremarkable. Kidneys are normal, without renal calculi, focal lesion, or hydronephrosis. Bladder is unremarkable. Stomach/Bowel: Stomach is within normal limits. Appendix appears normal. No evidence of bowel wall thickening, distention, or inflammatory changes. Vascular/Lymphatic: Nonaneurysmal aorta. Multiple enlarged bilateral inguinal lymph nodes, measuring up to 19 mm on the right and 13 mm on the left. Reproductive: Prostate is unremarkable. Other: Skin thickening and subcutaneous inflammation/edema  within the midline anterior lower abdominal wall. Superficial mildly rim enhancing fluid collection measuring 2.4 x 2.4 by 3.8 cm just deep to the skin surface in the region of inflammatory change consistent with a small soft tissue abscess. This is approximately 10 cm inferior to the umbilicus and is at the midline Musculoskeletal: Chronic bilateral pars defect at L5. No acute or suspicious bone lesion IMPRESSION: 1. Skin thickening and subcutaneous edema and inflammatory change within the lower midline anterior abdominal wall consistent with cellulitis. 2.4 x 3.8 cm mildly rim enhancing superficial fluid collection in the region of inflammatory change consistent with a soft tissue abscess. 2. Multiple enlarged inguinal lymph nodes are likely reactive. Electronically Signed   By: Kim  Fujinaga M.D.   On: 04/19/2017 22:55    Procedures ..Incision and Drainage Date/Time: 04/19/2017 11:08 PM Performed by: Bular Hickok Authorized by: Chennel Olivos   Consent:    Consent obtained:  Verbal   Consent given by:  Patient   Risks discussed:  Bleeding, damage to other organs, incomplete drainage, infection and pain   Alternatives discussed:  No  treatment, delayed treatment, alternative treatment, observation and referral Location:    Type:  Abscess   Size:  2   Location: Abdomen. Pre-procedure details:    Skin preparation:  Betadine Anesthesia (see MAR for exact dosages):    Anesthesia method:  Local infiltration   Local anesthetic:  Lidocaine 2% w/o epi Procedure type:    Complexity:  Simple Procedure details:    Needle aspiration: no     Incision types:  Stab incision and single straight   Incision depth:  Dermal   Scalpel blade:  11   Wound management:  Probed and deloculated, irrigated with saline and extensive cleaning   Drainage:  Purulent   Drainage amount:  Moderate   Wound treatment:  Wound left open   Packing materials:  1/4 in gauze Post-procedure details:    Patient tolerance  of procedure:  Tolerated well, no immediate complications    (including critical care time)  Medications Ordered in ED Medications  lidocaine (XYLOCAINE) 2 % injection 20 mL (not administered)  sulfamethoxazole-trimethoprim (BACTRIM DS,SEPTRA DS) 800-160 MG per tablet 2 tablet (not administered)  morphine 4 MG/ML injection 4 mg (4 mg Intravenous Given 04/19/17 2128)  iopamidol (ISOVUE-300) 61 % injection 100 mL (100 mLs Intravenous Contrast Given 04/19/17 2235)  morphine 4 MG/ML injection 4 mg (4 mg Intravenous Given 04/20/17 0019)     Initial Impression / Assessment and Plan / ED Course  I have reviewed the triage vital signs and the nursing notes.  Pertinent labs & imaging results that were available during my care of the patient were reviewed by me and considered in my medical decision making (see chart for details).      Final Clinical Impressions(s) / ED Diagnoses   Final diagnoses:  Abscess  Cellulitis, unspecified cellulitis site    Labs: CBC, BMP  Imaging:  Consults:  Therapeutics: Lidocaine  Discharge Meds: Bactrim, Percocet  Assessment/Plan: 31 year old male presents today with complaints of abscess.  I&D performed at bedside without complication.  He is surrounding cellulitis, started on antibiotics.  Patient will follow-up for reassessment if symptoms persist or worsen, he verbalized understanding and agreement to today's plan had no further questions or concerns the time discharge.  No signs of systemic illness.   New Prescriptions New Prescriptions   OXYCODONE-ACETAMINOPHEN (PERCOCET/ROXICET) 5-325 MG TABLET    Take 1-2 tablets by mouth every 4 (four) hours as needed for severe pain.   SULFAMETHOXAZOLE-TRIMETHOPRIM (BACTRIM DS,SEPTRA DS) 800-160 MG TABLET    Take 2 tablets by mouth 2 (two) times daily.     Eyvonne Mechanic, PA-C 04/20/17 0107    Derwood Kaplan, MD 04/20/17 [phone]

## 2017-04-19 NOTE — ED Notes (Signed)
Patient transported to CT 

## 2017-04-20 MED ORDER — SULFAMETHOXAZOLE-TRIMETHOPRIM 800-160 MG PO TABS
2.0000 | ORAL_TABLET | Freq: Two times a day (BID) | ORAL | 0 refills | Status: AC
Start: 2017-04-20 — End: 2017-04-30

## 2017-04-20 MED ORDER — OXYCODONE-ACETAMINOPHEN 5-325 MG PO TABS: 1.0000 | ORAL_TABLET | ORAL | 0 refills | Status: AC | PRN

## 2017-04-20 NOTE — Discharge Instructions (Signed)
Please read attached information. If you experience any new or worsening signs or symptoms please return to the emergency room for evaluation. Please follow-up with your primary care provider or specialist as discussed. Please use medication prescribed only as directed and discontinue taking if you have any concerning signs or symptoms.   °

## 2018-11-30 IMAGING — CT CT ABD-PELV W/ CM
2 of 4 series · 16 of 46 positions shown, 18 images · IV contrast (ISOVUE)
Comparison: [DATE]

CLINICAL DATA: Redness the lower abdomen for 4-5 days

EXAM:
CT ABDOMEN AND PELVIS WITH CONTRAST
TECHNIQUE: Multidetector CT imaging of the abdomen and pelvis was performed
using the standard protocol following bolus administration of
intravenous contrast.
CONTRAST:  100mL 0WW1JK-IBB IOPAMIDOL (0WW1JK-IBB) INJECTION 61%

[Series 2: abd/pel with · axial · 0.78mm/px · z∈[+1000,+1440]mm · 13 of 100 slices shown, 15 images]
[im 6/100  soft-tissue]
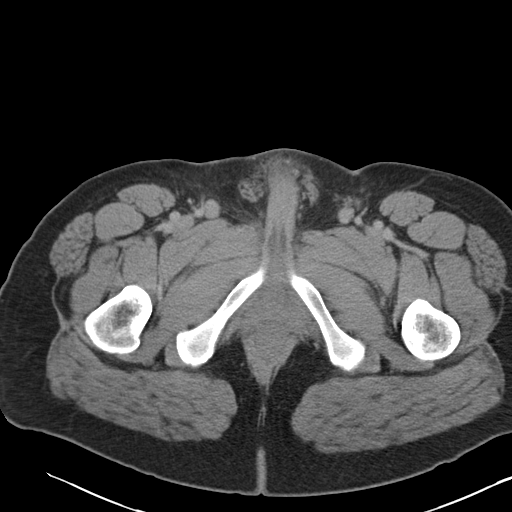
[im 6/100  bone]
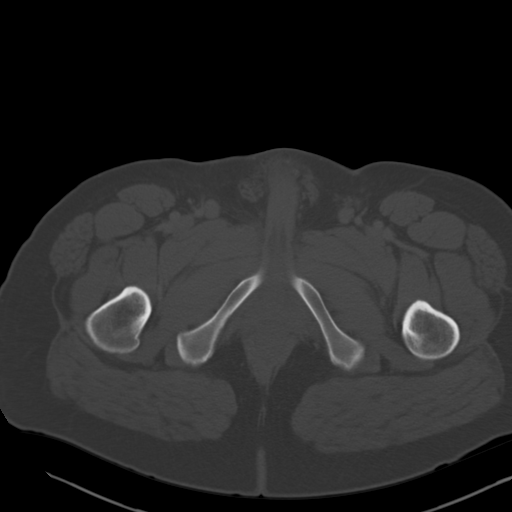
[im 16/100  soft-tissue]
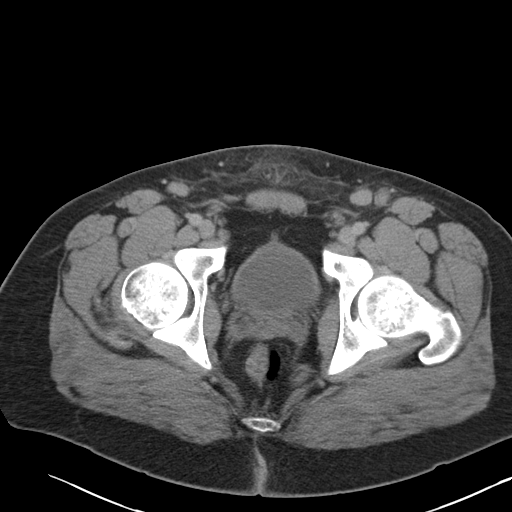
[im 21/100  soft-tissue]
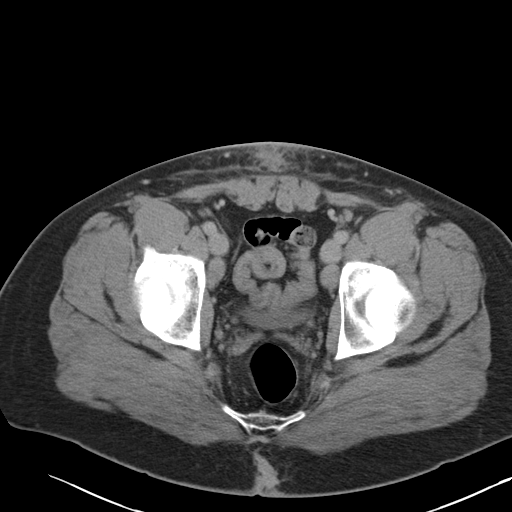
[im 27/100  soft-tissue]
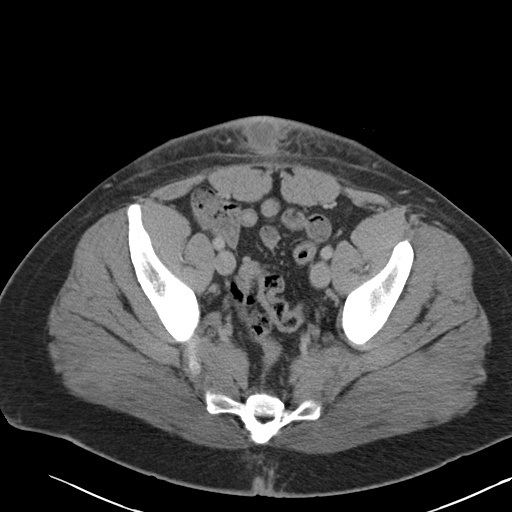
[im 37/100  soft-tissue]
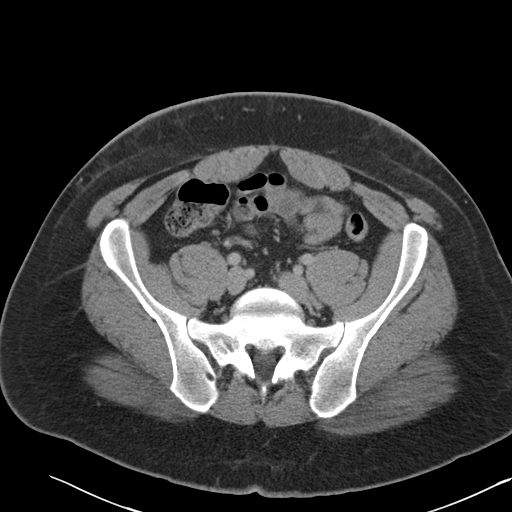
[im 42/100  soft-tissue]
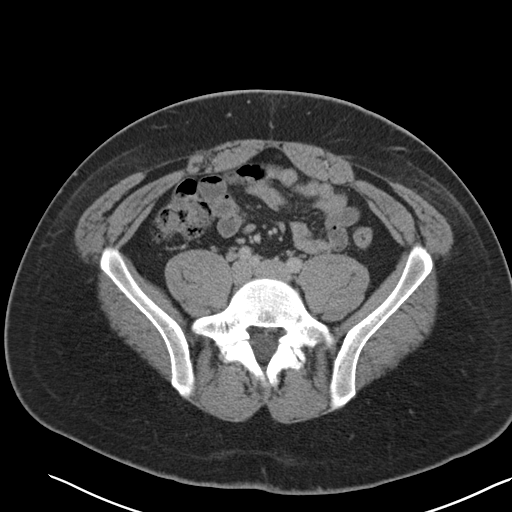
[im 53/100  soft-tissue]
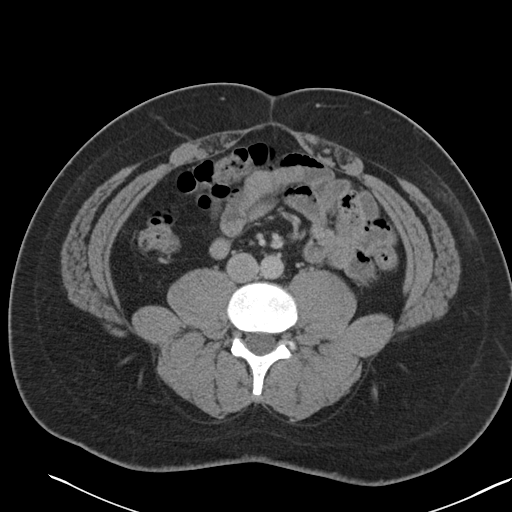
[im 58/100  soft-tissue]
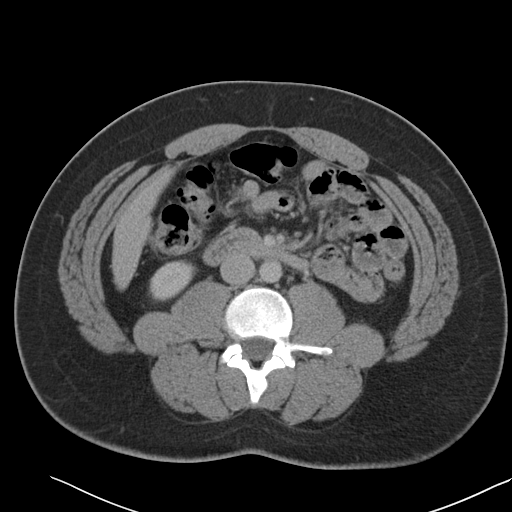
[im 63/100  soft-tissue]
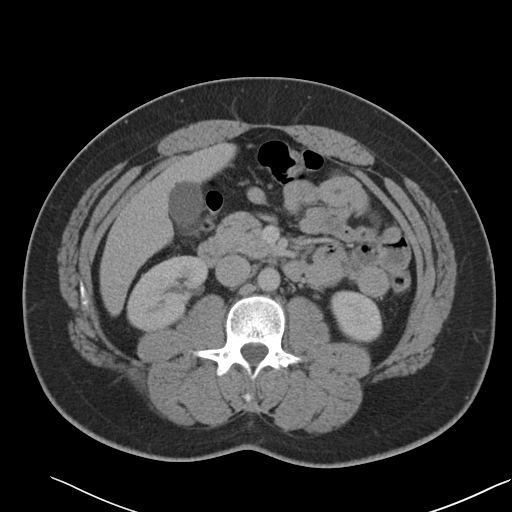
[im 63/100  bone]
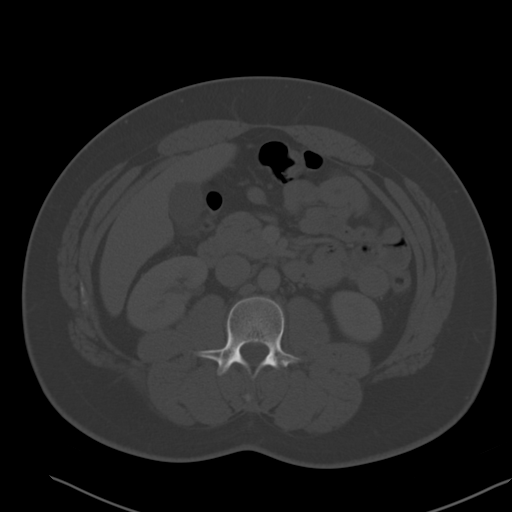
[im 73/100  soft-tissue]
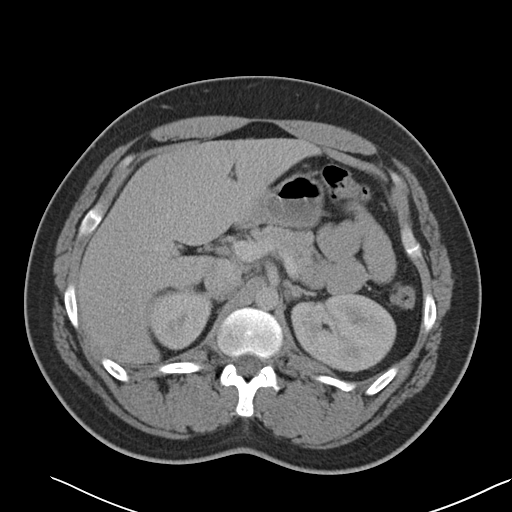
[im 79/100  soft-tissue]
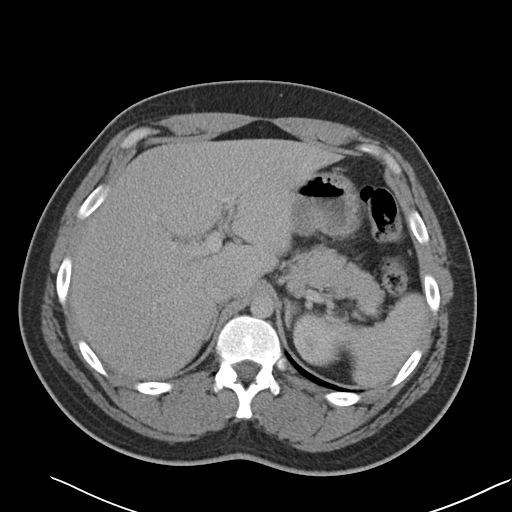
[im 84/100  soft-tissue]
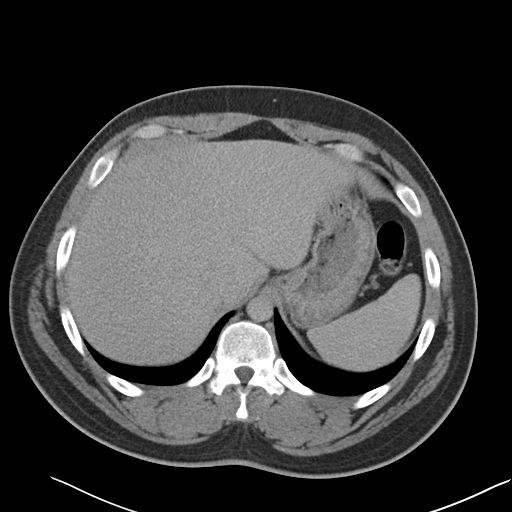
[im 94/100  soft-tissue]
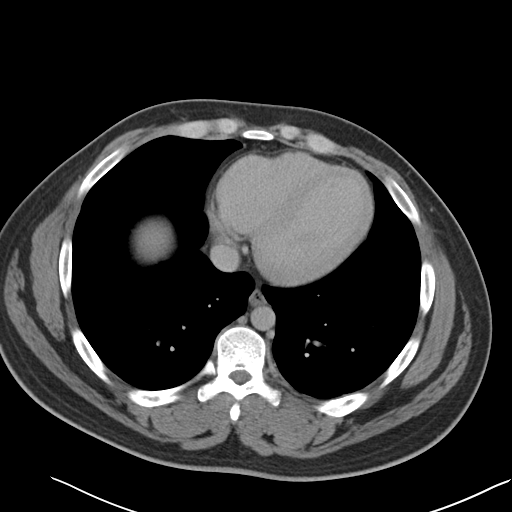

[Series 5: coronal a/|p · coronal · 0.72mm/px · 3 of 165 slices shown]
[im 55/165  soft-tissue]
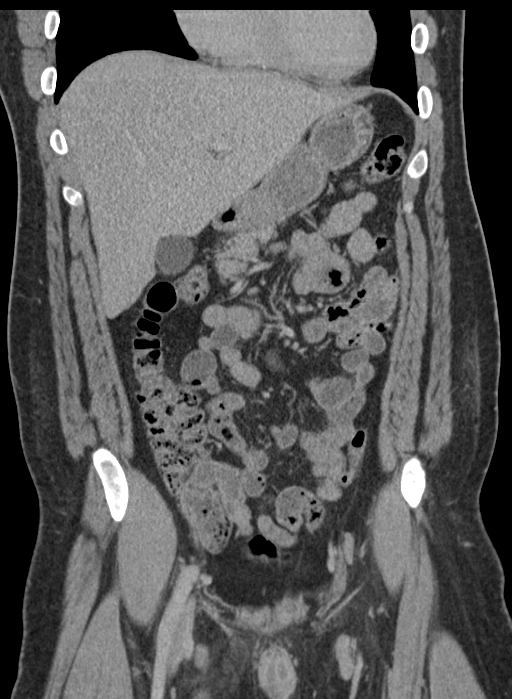
[im 73/165  soft-tissue]
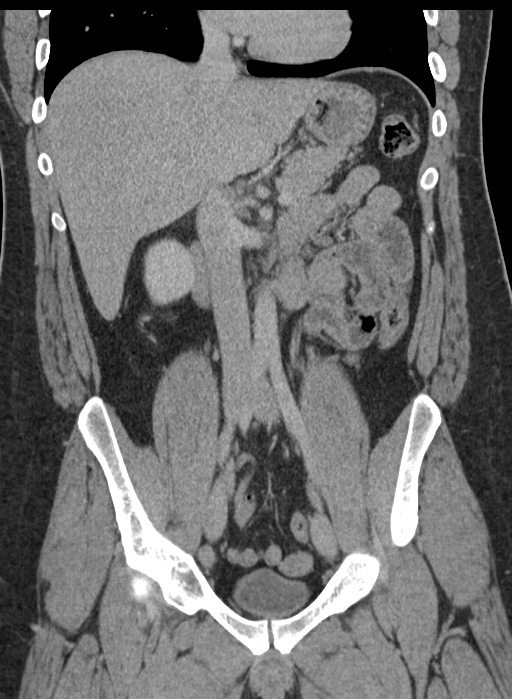
[im 92/165  soft-tissue]
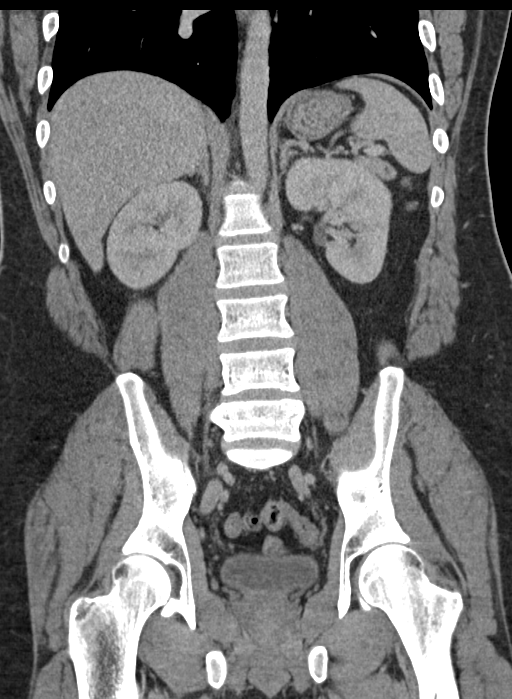

[16 of 46 positions shown; findings below may reference images not displayed]

FINDINGS: Lower chest: No acute abnormality.

Hepatobiliary: No focal liver abnormality is seen. No gallstones,
gallbladder wall thickening, or biliary dilatation.

Pancreas: Unremarkable. No pancreatic ductal dilatation or
surrounding inflammatory changes.

Spleen: Normal in size without focal abnormality.

Adrenals/Urinary Tract: Adrenal glands are unremarkable. Kidneys are
normal, without renal calculi, focal lesion, or hydronephrosis.
Bladder is unremarkable.

Stomach/Bowel: Stomach is within normal limits. Appendix appears
normal. No evidence of bowel wall thickening, distention, or
inflammatory changes.

Vascular/Lymphatic: Nonaneurysmal aorta. Multiple enlarged bilateral
inguinal lymph nodes, measuring up to 19 mm on the right and 13 mm
on the left.

Reproductive: Prostate is unremarkable.

Other: Skin thickening and subcutaneous inflammation/edema within
the midline anterior lower abdominal wall. Superficial mildly rim
enhancing fluid collection measuring 2.4 x 2.4 by 3.8 cm just deep
to the skin surface in the region of inflammatory change consistent
with a small soft tissue abscess. This is approximately 10 cm
inferior to the umbilicus and is at the midline

Musculoskeletal: Chronic bilateral pars defect at L5. No acute or
suspicious bone lesion
IMPRESSION: 1. Skin thickening and subcutaneous edema and inflammatory change
within the lower midline anterior abdominal wall consistent with
cellulitis. 2.4 x 3.8 cm mildly rim enhancing superficial fluid
collection in the region of inflammatory change consistent with a
soft tissue abscess.
2. Multiple enlarged inguinal lymph nodes are likely reactive.

## 2021-03-31 ENCOUNTER — Emergency Department (HOSPITAL_BASED_OUTPATIENT_CLINIC_OR_DEPARTMENT_OTHER)
Admission: EM | Admit: 2021-03-31 | Discharge: 2021-03-31 | Disposition: A | Discharge status: Home / Self Care | Payer: Self-pay | Attending: Emergency Medicine | Admitting: Emergency Medicine

## 2021-03-31 ENCOUNTER — Emergency Department (HOSPITAL_BASED_OUTPATIENT_CLINIC_OR_DEPARTMENT_OTHER): Payer: Self-pay

## 2021-03-31 ENCOUNTER — Encounter (HOSPITAL_BASED_OUTPATIENT_CLINIC_OR_DEPARTMENT_OTHER): Payer: Self-pay | Admitting: *Deleted

## 2021-03-31 ENCOUNTER — Other Ambulatory Visit: Payer: Self-pay

## 2021-03-31 DIAGNOSIS — R072 Precordial pain: Secondary | ICD-10-CM | POA: Insufficient documentation

## 2021-03-31 DIAGNOSIS — H5789 Other specified disorders of eye and adnexa: Secondary | ICD-10-CM | POA: Insufficient documentation

## 2021-03-31 DIAGNOSIS — L02411 Cutaneous abscess of right axilla: Secondary | ICD-10-CM | POA: Insufficient documentation

## 2021-03-31 DIAGNOSIS — F17210 Nicotine dependence, cigarettes, uncomplicated: Secondary | ICD-10-CM | POA: Insufficient documentation

## 2021-03-31 DIAGNOSIS — R079 Chest pain, unspecified: Secondary | ICD-10-CM

## 2021-03-31 MED ORDER — FLUORESCEIN SODIUM 1 MG OP STRP
1.0000 | ORAL_STRIP | Freq: Once | OPHTHALMIC | Status: AC
  Administered 2021-03-31: 1 via OPHTHALMIC
  Filled 2021-03-31: qty 1

## 2021-03-31 MED ORDER — LIDOCAINE-EPINEPHRINE (PF) 2 %-1:200000 IJ SOLN
10.0000 mL | Freq: Once | INTRAMUSCULAR | Status: AC
  Administered 2021-03-31: 10 mL
  Filled 2021-03-31: qty 20

## 2021-03-31 MED ORDER — TETRACAINE HCL 0.5 % OP SOLN
2.0000 [drp] | Freq: Once | OPHTHALMIC | Status: AC
  Administered 2021-03-31: 2 [drp] via OPHTHALMIC
  Filled 2021-03-31: qty 4

## 2021-03-31 NOTE — ED Provider Notes (Signed)
[facility] EMERGENCY DEPARTMENT Provider Note   CSN: [id number] Arrival date & time: 03/31/21  1755     History Chief Complaint  Patient presents with   Abscess    Richard Huber is a 35 y.o. male with a history of hidradenitis suppurativa.  Patient presents to the emergency department with a chief complaint of right axilla abscess, right eye redness, and chest pain.  Patient reports that his chest pain began yesterday.  Chest pain has been intermittent since then.  Chest pain is midsternal and does not radiate.  Patient describes pain as a "heaviness."  Patient reports last episode of chest pain started just prior to arrival in emergency department.  Patient denies any chest pain at present.  Patient denies any alleviating or aggravating factors.  No associated nausea, vomiting, shortness of breath, or diaphoresis.  Patient reports abscess to right axilla began Sunday or Monday.  Patient reports that swelling has gradually increased since then.  Endorses associated pain.  Patient rates pain 8/10 on the pain scale.  Pain is worse with touch.  Pain has improved with warm compresses and ibuprofen.  Patient denies any purulent discharge, erythema, rash, fevers, chills, nausea, vomiting.  Patient reports right eye redness started this morning.  Patient endorses foreign body sensation.  Patient denies any injury or chemical exposure to affected eye.  Patient denies any vision loss, discharge, visual disturbance, eye pain, eye pruritus.   Abscess Associated symptoms: no fever, no headaches, no nausea and no vomiting       Past Medical History:  Diagnosis Date   Hidradenitis suppurativa    Perirectal abscess    RECURRENT   Pilonidal cyst    CHRONIC    Patient Active Problem List   Diagnosis Date Noted   Perirectal abscess 09/30/2013   Pilonidal cyst 09/30/2013   Marijuana use 09/30/2013    Past Surgical History:  Procedure Laterality Date   EUA/  I  &  D COMPLEX  PERIRECTAL ABSCESS  01-03-2011   INCISION AND DRAINAGE PERIRECTAL ABSCESS N/A 09/30/2013   Procedure: IRRIGATION AND DEBRIDEMENT PERIRECTAL ABSCESS;  Surgeon: Velora Hecklerodd M Gerkin, MD;  Location: [facility];  Service: General;  Laterality: N/A;       No family history on file.  Social History   Tobacco Use   Smoking status: Every Day    Packs/day: 0.25    Years: 1.00    Pack years: 0.25    Types: Cigarettes    Last attempt to quit: 06/03/2013    Years since quitting: 7.8   Smokeless tobacco: Never  Vaping Use   Vaping Use: Never used  Substance Use Topics   Alcohol use: Yes    Comment: SOCIAL    Home Medications Prior to Admission medications   Medication Sig Start Date End Date Taking? Authorizing Provider  ibuprofen (ADVIL,MOTRIN) 200 MG tablet Take 600 mg by mouth every 6 (six) hours as needed for moderate pain.   Yes [provider]  acetaminophen (TYLENOL) 500 MG tablet Take 1,000 mg by mouth every 6 (six) hours as needed for moderate pain.    [provider]  amoxicillin-clavulanate (AUGMENTIN) 875-125 MG per tablet Take 1 tablet by mouth 2 (two) times daily. One po bid x 7 days Patient not taking: No sig reported 11/23/14   Charlynne PanderYao, David Hsienta, MD  aspirin-acetaminophen-caffeine [facility](EXCEDRIN MIGRAINE) [id number] MG tablet Take 1 tablet by mouth every 6 (six) hours as needed for headache or migraine.    [provider]  docusate sodium (COLACE) 100 MG capsule Take 1 capsule (100 mg total) by mouth 2 (two) times daily. Patient not taking: No sig reported 12/01/13   Romie Levee, MD  oxyCODONE-acetaminophen (PERCOCET/ROXICET) 5-325 MG tablet Take 1-2 tablets by mouth every 4 (four) hours as needed for severe pain. 04/20/17   Eyvonne Mechanic, PA-C    Allergies    Patient has no known allergies.  Review of Systems   Review of Systems  Constitutional:  Negative for chills and fever.  Eyes:  Positive for redness. Negative for photophobia, pain, discharge, itching  and visual disturbance.  Respiratory:  Negative for shortness of breath.   Cardiovascular:  Negative for chest pain, palpitations and leg swelling.  Gastrointestinal:  Negative for abdominal pain, nausea and vomiting.  Musculoskeletal:  Negative for back pain and neck pain.  Skin:  Negative for color change, pallor, rash and wound.  Allergic/Immunologic: Negative for immunocompromised state.  Neurological:  Negative for dizziness, syncope, light-headedness and headaches.  Psychiatric/Behavioral:  Negative for confusion.    Physical Exam Updated Vital Signs BP (!) 152/104 (BP Location: Left Arm)   Pulse 70   Temp 98.4 F (36.9 C) (Oral)   Resp 20   Ht 6\' 4"  (1.93 m)   Wt 129.7 kg   SpO2 98%   BMI 34.81 kg/m   Physical Exam Vitals and nursing note reviewed.  Constitutional:      General: He is not in acute distress.    Appearance: He is obese. He is not ill-appearing, toxic-appearing or diaphoretic.  HENT:     Head: Normocephalic. No right periorbital erythema or left periorbital erythema.  Eyes:     General: Lids are everted, no foreign bodies appreciated. Vision grossly intact. No scleral icterus.       Right eye: No foreign body, discharge or hordeolum.        Left eye: No foreign body, discharge or hordeolum.     Intraocular pressure: Right eye pressure is 10 mmHg. Left eye pressure is 12 mmHg. Measurements were taken using a handheld tonometer.    Extraocular Movements: Extraocular movements intact.     Conjunctiva/sclera:     Right eye: Right conjunctiva is injected. No chemosis, exudate or hemorrhage.    Left eye: Left conjunctiva is not injected. No chemosis, exudate or hemorrhage.    Pupils: Pupils are equal, round, and reactive to light.     Right eye: No corneal abrasion or fluorescein uptake. Seidel exam negative.     Left eye: No corneal abrasion or fluorescein uptake. Seidel exam negative.    Comments: No pain with EOM.  Patient has injection to lateral aspect of  right sclera.  Cardiovascular:     Rate and Rhythm: Normal rate.  Pulmonary:     Effort: Pulmonary effort is normal. No tachypnea, bradypnea or respiratory distress.     Breath sounds: Normal breath sounds. No stridor.  Abdominal:     General: There is no distension. There are no signs of injury.     Palpations: Abdomen is soft. There is no mass or pulsatile mass.     Tenderness: There is no abdominal tenderness. There is no guarding or rebound.  Musculoskeletal:     Right lower leg: Normal.     Left lower leg: Normal.     Comments: 4 cm area of fluctuance to right axilla.  no surrounding induration, erythema, wound, rash.  No purulent discharge.  Abscess is tender to palpation.  Skin:    General: Skin is  warm and dry.  Neurological:     General: No focal deficit present.     Mental Status: He is alert and oriented to person, place, and time.     GCS: GCS eye subscore is 4. GCS verbal subscore is 5. GCS motor subscore is 6.  Psychiatric:        Behavior: Behavior is cooperative.    ED Results / Procedures / Treatments   Labs (all labs ordered are listed, but only abnormal results are displayed) Labs Reviewed - No data to display  EKG None  Radiology DG Chest 2 View  Result Date: 03/31/2021 CLINICAL DATA:  Chest pain EXAM: CHEST - 2 VIEW COMPARISON:  11/23/2014 FINDINGS: The heart size and mediastinal contours are within normal limits. Both lungs are clear. The visualized skeletal structures are unremarkable. IMPRESSION: No active cardiopulmonary disease. Electronically Signed   By: Jasmine Pang M.D.   On: 03/31/2021 19:46    Procedures .Marland KitchenIncision and Drainage  Date/Time: 04/01/2021 12:57 AM Performed by: Haskel Schroeder, PA-C Authorized by: Haskel Schroeder, PA-C   Consent:    Consent obtained:  Verbal   Consent given by:  Patient   Risks discussed:  Bleeding, incomplete drainage, pain, damage to other organs and infection   Alternatives discussed:  No  treatment Universal protocol:    Procedure explained and questions answered to patient or proxy's satisfaction: yes     Immediately prior to procedure, a time out was called: yes     Patient identity confirmed:  Verbally with patient and arm band Location:    Type:  Abscess   Location:  Upper extremity   Upper extremity location: right axilla. Pre-procedure details:    Skin preparation:  Betadine Anesthesia:    Anesthesia method:  Local infiltration   Local anesthetic:  Lidocaine 2% WITH epi Procedure type:    Complexity:  Complex Procedure details:    Incision types:  Single straight   Incision depth:  Subcutaneous   Wound management:  Irrigated with saline and extensive cleaning   Drainage:  Purulent   Drainage amount:  Moderate   Packing materials:  None Post-procedure details:    Procedure completion:  Tolerated well, no immediate complications Comments:     Patient refused packing material   Medications Ordered in ED Medications - No data to display  ED Course  I have reviewed the triage vital signs and the nursing notes.  Pertinent labs & imaging results that were available during my care of the patient were reviewed by me and considered in my medical decision making (see chart for details).    MDM Rules/Calculators/A&P                           Alert 35 year old male no acute distress, nontoxic appearing.  Patient presents to the emergency department with a chief complaint of right axilla abscess, right eye redness, and chest pain.  Chest Pain Low suspicion for PE as patient can be ruled out via [facility] criteria.  Will obtain ACS work-up due to reports of chest pain.  EKG shows sinus rhythm with sinus arrhythmia.  Chest x-ray shows no active cardiopulmonary disease.  Patient refused any blood work to be obtained.  I spoke with patient and explained that without lab work we cannot fully assess if chest pain is cardiac in nature, if his chest pain is due to myocardial  infarction then without proper treatment he could leave to life-threatening complications including  death.  Patient expresses understanding and continues to refuse lab draw.  Eye Redness Patient has no purulent discharge or eye pruritus; low suspicion for conjunctivitis.  Patient has no pain with EOM or periorbital erythema; low suspicion for orbital or preseptal cellulitis.  No foreign bodies appreciated.  No fluorescein dye uptake, negative Seidel sign.  Intraocular pressure within normal limits.  Abscess Right Axilla Patient has abscess to right axilla.  Low suspicion for cellulitis infection as there is no surrounding erythema.  I&D procedure performed as noted above.  Patient refused any packing be placed.  Due to patient's history of hidradenitis suppurativa patient given information to follow-up with dermatology.  Discussed results, findings, treatment and follow up. Patient advised of return precautions. Patient verbalized understanding and agreed with plan.   Final Clinical Impression(s) / ED Diagnoses Final diagnoses:  Abscess of axilla, right  Redness of eye, right  Chest pain, unspecified type    Rx / DC Orders ED Discharge Orders     None        Haskel Schroeder, PA-C 04/01/21 [id number]    Arby Barrette, MD 04/01/21 1439

## 2021-03-31 NOTE — ED Notes (Signed)
Patient transported to X-ray 

## 2021-03-31 NOTE — ED Triage Notes (Signed)
Abscess right axilla. States he has been stressing over his body causing him to have pain in the center of his chest since yesterday. Today his right eye is red and painful.

## 2021-03-31 NOTE — Discharge Instructions (Addendum)
You came to the emergency department today to be evaluated for your right eye redness, abscess, and chest pain.  You refused lab work and we were unable to determine if your chest pain was due to a cardiac cause.  Please return to the emergency department for further chest pain as needed.  You had an abscess to your right armpit.  You have a history of hidradenitis suppurativa.  Please follow-up with dermatology for further care of your hidradenitis suppurativa.  Your eye exam was reassuring.  The redness is likely due to irritation and will improve over time.  Please return to the emergency department if you have any vision loss or visual disturbance.  Get help right away if: Your chest pain gets worse. You have a cough that gets worse, or you cough up blood. You have severe pain in your abdomen. You faint. You have sudden, unexplained chest discomfort. You have sudden, unexplained discomfort in your arms, back, neck, or jaw. You have shortness of breath at any time. You suddenly start to sweat, or your skin gets clammy. You feel nausea or you vomit. You suddenly feel lightheaded or dizzy. You have severe weakness, or unexplained weakness or fatigue. Your heart begins to beat quickly, or it feels like it is skipping beats. You have severe pain or bleeding. You cannot eat or drink without vomiting. You have decreased urine output. You become short of breath. You have chest pain. You cough up blood. The affected area becomes numb or starts to tingle.  [facility] Dermatologists:   [facility]  3.2 [phone])  Dermatologist  [address] [place] [address] [place]  [phone]   Dr. Mertha Finders, MD  2.6 [phone])  Dermatologist  [address] [place]  [phone]  [facility]  3.5 (3)  [facility]  [address] [place]  [phone]   [facility]  4.0 (4)  Dermatologist  [address]  [phone]  Janalyn Harder MD  3.0 (2)   Dermatologist  [address]  [phone]  Hoyle Sauer  2.7 (6)  Dermatologist  [address] [place]  [phone]  [place] Amy Y MD  2.0 (1)  Dermatologist  [address] [place]  [phone]  [facility]  5.0 (3)  Doctor  [address]  [phone]

## 2021-03-31 NOTE — ED Notes (Signed)
Pt refused blood work stating "I dont want any blood drawn, I just want my abscess drained." provider informed.

## 2022-11-11 IMAGING — CR DG CHEST 2V
2 series · 2 of 2 positions shown · non-contrast
Comparison: 11/23/2014

CLINICAL DATA: Chest pain

EXAM:
CHEST - 2 VIEW

[w chest pa]
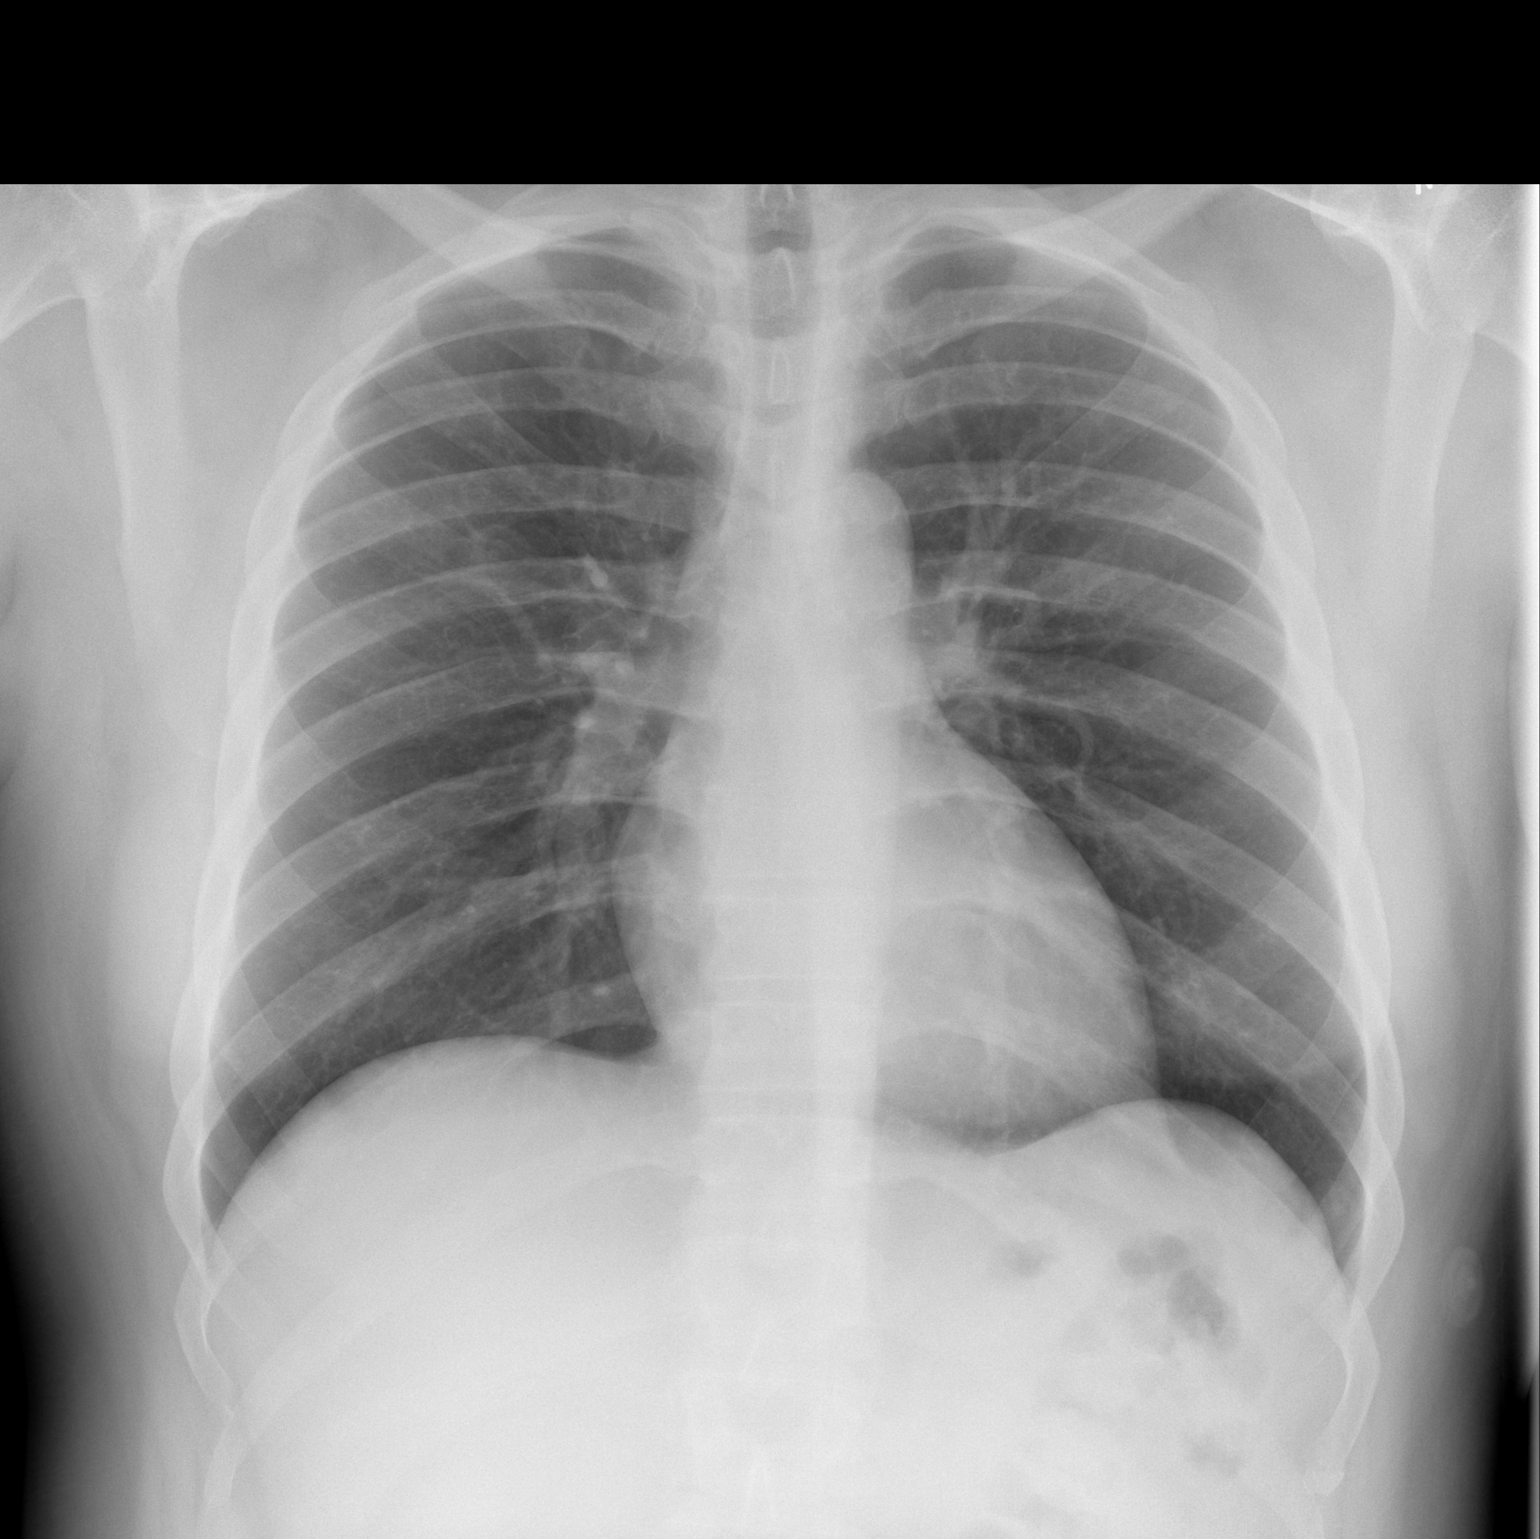

[w chest lat]
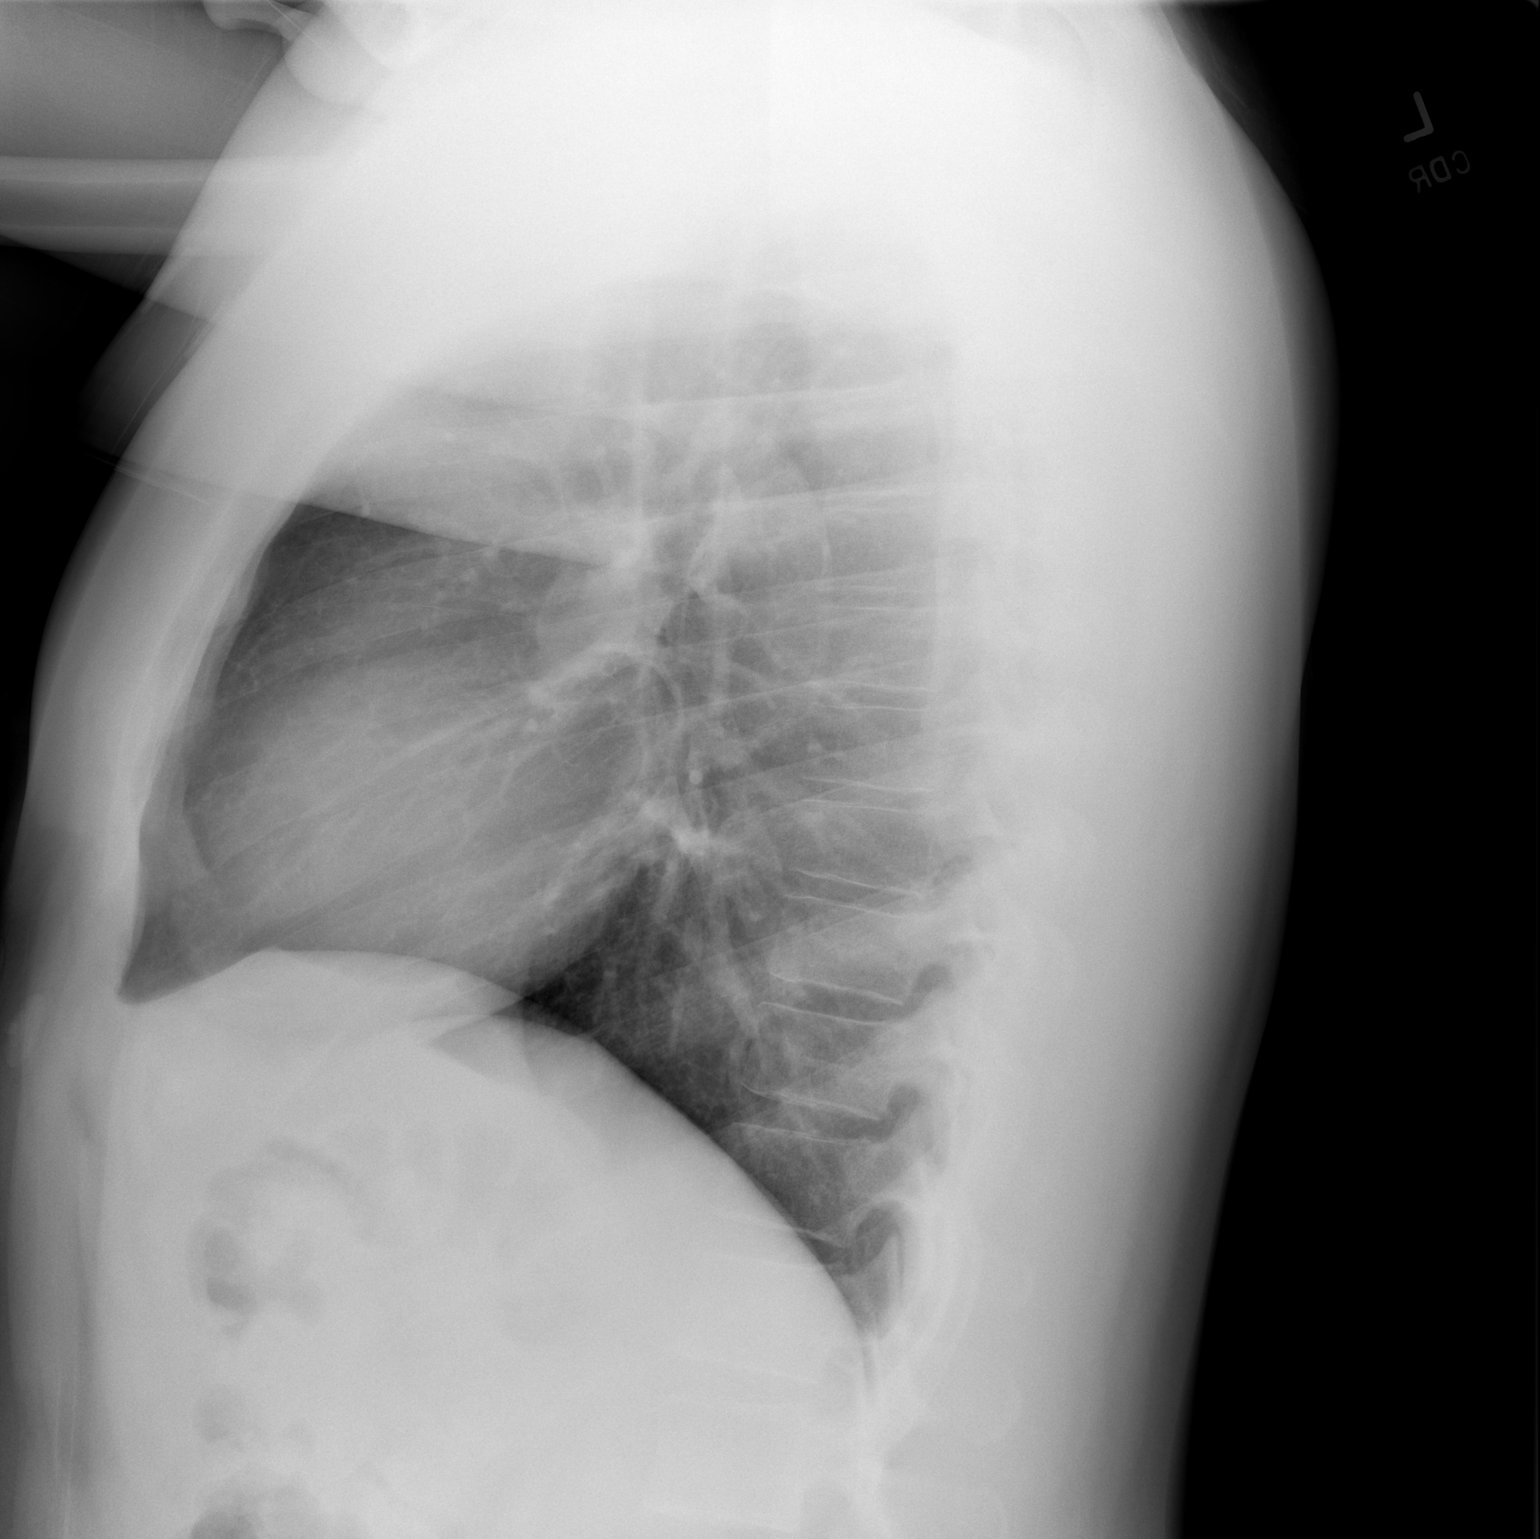

[2 of 2 positions shown; findings below may reference images not displayed]

FINDINGS: The heart size and mediastinal contours are within normal limits.
Both lungs are clear. The visualized skeletal structures are
unremarkable.
IMPRESSION: No active cardiopulmonary disease.
